# Patient Record
Sex: Male | Born: 1995 | Race: Black or African American | Hispanic: No | Marital: Single | Smoking: Current some day smoker
Health system: Southern US, Community
[De-identification: ages and names within clinical notes are randomized; demographics above are authoritative.]

## PROBLEM LIST (undated history)

## (undated) DIAGNOSIS — F32A Depression, unspecified: Secondary | ICD-10-CM

## (undated) DIAGNOSIS — L309 Dermatitis, unspecified: Secondary | ICD-10-CM

## (undated) DIAGNOSIS — F419 Anxiety disorder, unspecified: Secondary | ICD-10-CM

## (undated) DIAGNOSIS — K219 Gastro-esophageal reflux disease without esophagitis: Secondary | ICD-10-CM

## (undated) DIAGNOSIS — J302 Other seasonal allergic rhinitis: Secondary | ICD-10-CM

## (undated) DIAGNOSIS — F909 Attention-deficit hyperactivity disorder, unspecified type: Secondary | ICD-10-CM

## (undated) DIAGNOSIS — F319 Bipolar disorder, unspecified: Secondary | ICD-10-CM

## (undated) DIAGNOSIS — F329 Major depressive disorder, single episode, unspecified: Secondary | ICD-10-CM

## (undated) HISTORY — DX: Gastro-esophageal reflux disease without esophagitis: K21.9

## (undated) HISTORY — DX: Bipolar disorder, unspecified: F31.9

## (undated) HISTORY — PX: TONSILLECTOMY: SUR1361

## (undated) HISTORY — PX: APPENDECTOMY: SHX54

## (undated) HISTORY — PX: ADENOIDECTOMY: SUR15

## (undated) HISTORY — DX: Anxiety disorder, unspecified: F41.9

---
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
## 2014-01-01 ENCOUNTER — Encounter (HOSPITAL_COMMUNITY): Payer: Self-pay | Admitting: Emergency Medicine

## 2014-01-01 ENCOUNTER — Emergency Department (HOSPITAL_COMMUNITY)
Admission: EM | Admit: 2014-01-01 | Discharge: 2014-01-02 | Disposition: A | Discharge status: Home / Self Care | Payer: Medicaid Other | Attending: Emergency Medicine | Admitting: Emergency Medicine

## 2014-01-01 ENCOUNTER — Emergency Department (HOSPITAL_COMMUNITY): Payer: Medicaid Other

## 2014-01-01 DIAGNOSIS — R109 Unspecified abdominal pain: Secondary | ICD-10-CM | POA: Insufficient documentation

## 2014-01-01 DIAGNOSIS — Z872 Personal history of diseases of the skin and subcutaneous tissue: Secondary | ICD-10-CM | POA: Insufficient documentation

## 2014-01-01 DIAGNOSIS — F17200 Nicotine dependence, unspecified, uncomplicated: Secondary | ICD-10-CM | POA: Insufficient documentation

## 2014-01-01 DIAGNOSIS — R0789 Other chest pain: Secondary | ICD-10-CM | POA: Insufficient documentation

## 2014-01-01 DIAGNOSIS — Z79899 Other long term (current) drug therapy: Secondary | ICD-10-CM | POA: Insufficient documentation

## 2014-01-01 DIAGNOSIS — Z880 Allergy status to penicillin: Secondary | ICD-10-CM | POA: Insufficient documentation

## 2014-01-01 DIAGNOSIS — Z8719 Personal history of other diseases of the digestive system: Secondary | ICD-10-CM | POA: Insufficient documentation

## 2014-01-01 DIAGNOSIS — Z9089 Acquired absence of other organs: Secondary | ICD-10-CM | POA: Insufficient documentation

## 2014-01-01 DIAGNOSIS — J45909 Unspecified asthma, uncomplicated: Secondary | ICD-10-CM | POA: Insufficient documentation

## 2014-01-01 DIAGNOSIS — Z8659 Personal history of other mental and behavioral disorders: Secondary | ICD-10-CM | POA: Insufficient documentation

## 2014-01-01 HISTORY — DX: Depression, unspecified: F32.A

## 2014-01-01 HISTORY — DX: Attention-deficit hyperactivity disorder, unspecified type: F90.9

## 2014-01-01 HISTORY — DX: Major depressive disorder, single episode, unspecified: F32.9

## 2014-01-01 LAB — CBC
HEMATOCRIT: 39.9 % (ref 39.0–52.0)
HEMOGLOBIN: 12.8 g/dL — AB (ref 13.0–17.0)
MCH: 28.1 pg (ref 26.0–34.0)
MCHC: 32.1 g/dL (ref 30.0–36.0)
MCV: 87.7 fL (ref 78.0–100.0)
Platelets: 160 10*3/uL (ref 150–400)
RBC: 4.55 MIL/uL (ref 4.22–5.81)
RDW: 13.5 % (ref 11.5–15.5)
WBC: 6.3 10*3/uL (ref 4.0–10.5)

## 2014-01-01 LAB — BASIC METABOLIC PANEL
BUN: 9 mg/dL (ref 6–23)
CO2: 25 meq/L (ref 19–32)
CREATININE: 0.93 mg/dL (ref 0.50–1.35)
Calcium: 9.2 mg/dL (ref 8.4–10.5)
Chloride: 101 mEq/L (ref 96–112)
GFR calc Af Amer: >90 mL/min (ref >90)
Glucose, Bld: 98 mg/dL (ref 70–99)
Potassium: 4.2 mEq/L (ref 3.7–5.3)
Sodium: 138 mEq/L (ref 137–147)

## 2014-01-01 LAB — I-STAT TROPONIN, ED: TROPONIN I, POC: 0 ng/mL (ref 0.00–0.08)

## 2014-01-01 LAB — PRO B NATRIURETIC PEPTIDE: Pro B Natriuretic peptide (BNP): 7.5 pg/mL (ref 0–125)

## 2014-01-01 NOTE — ED Provider Notes (Signed)
CSN: [id number]     Arrival date & time 01/01/14  2141 History   First MD Initiated Contact with Patient 01/01/14 2341     Chief Complaint  Patient presents with  . Chest Pain     (Consider location/radiation/quality/duration/timing/severity/associated sxs/prior Treatment) HPI Patient presents with a complaints of chest tightness starting at 9:00 this evening. He states "it feels like a heavy fatty tissue around my heart". He states "it feels like my arms are going lifeless". He also states that the discomfort started in his abdomen and his progress to his "whole torso". The chest tightness is improved with movement of his upper extremities above his head. Patient denies any lower extremity swelling or pain. He's had no recent hospitalizations, surgeries or extended travel. He denies cough or shortness of breath. Past Medical History  Diagnosis Date  . Asthma   . Seasonal allergies   . Eczema   . GERD (gastroesophageal reflux disease)   . ADHD (attention deficit hyperactivity disorder)   . Depression    Past Surgical History  Procedure Laterality Date  . Tonsillectomy    . Adenoidectomy    . Laparoscopic appendectomy N/A 08/19/2013    Procedure: APPENDECTOMY LAPAROSCOPIC;  Surgeon: Shelly Rubenstein, MD;  Location: [facility] OR;  Service: General;  Laterality: N/A;  . Appendectomy     Family History  Problem Relation Age of Onset  . Cancer Paternal Grandmother     ovarian   History  Substance Use Topics  . Smoking status: Current Every Day Smoker -- 0.25 packs/day    Types: Cigarettes  . Smokeless tobacco: Never Used  . Alcohol Use: Yes    Review of Systems  Constitutional: Negative for fever and chills.  Respiratory: Positive for chest tightness. Negative for cough and shortness of breath.   Cardiovascular: Positive for chest pain. Negative for palpitations and leg swelling.  Gastrointestinal: Positive for abdominal pain. Negative for nausea, vomiting and diarrhea.   Musculoskeletal: Negative for back pain, neck pain and neck stiffness.  Skin: Negative for rash and wound.  Neurological: Negative for dizziness, weakness, light-headedness, numbness and headaches.  All other systems reviewed and are negative.     Allergies  Minocin; Shellfish allergy; Amoxicillin-pot clavulanate; and Penicillins  Home Medications   Prior to Admission medications   Medication Sig Start Date End Date Taking? Authorizing Provider  albuterol (PROVENTIL HFA;VENTOLIN HFA) 108 (90 BASE) MCG/ACT inhaler Inhale 4 puffs into the lungs every 4 (four) hours as needed for wheezing. 03/31/13   Arley Phenix, MD  cetirizine (ZYRTEC) 10 MG chewable tablet Chew 10 mg by mouth daily.    Historical Provider, MD  griseofulvin microsize (GRIFULVIN V) 125 MG/5ML suspension Take 250 mg by mouth 2 (two) times daily.     Historical Provider, MD   BP 107/61  Pulse 73  Temp(Src) 97.8 F (36.6 C) (Oral)  Resp 19  Ht 6' (1.829 m)  Wt 180 lb (81.647 kg)  BMI 24.41 kg/m2  SpO2 100% Physical Exam  Nursing note and vitals reviewed. Constitutional: He is oriented to person, place, and time. He appears well-developed and well-nourished. No distress.  Patient is in no acute distress.  HENT:  Head: Normocephalic and atraumatic.  Mouth/Throat: Oropharynx is clear and moist. No oropharyngeal exudate.  Eyes: EOM are normal. Pupils are equal, round, and reactive to light.  Neck: Normal range of motion. Neck supple.  Cardiovascular: Normal rate and regular rhythm.  Exam reveals no gallop and no friction rub.   No murmur  heard. Pulmonary/Chest: Effort normal and breath sounds normal. No respiratory distress. He has no wheezes. He has no rales. He exhibits tenderness (mild chest tenderness with palpation of the anterior chest wall diffusely.).  Abdominal: Soft. Bowel sounds are normal. He exhibits no distension and no mass. There is no tenderness. There is no rebound and no guarding.   Musculoskeletal: Normal range of motion. He exhibits no edema and no tenderness.  No calf swelling or tenderness.  Neurological: He is alert and oriented to person, place, and time.  Moves all extremities without deficit. Sensation is grossly intact.  Skin: Skin is warm and dry. No rash noted. No erythema.  Psychiatric:  Dysphoric mood with flat affect.    ED Course  Procedures (including critical care time) Labs Review Labs Reviewed  CBC - Abnormal; Notable for the following:    Hemoglobin 12.8 (*)    All other components within normal limits  BASIC METABOLIC PANEL  PRO B NATRIURETIC PEPTIDE  I-STAT TROPOININ, ED    Imaging Review Dg Chest 2 View  01/01/2014   CLINICAL DATA:  18 year old male with chest pain shortness of breath and fever. Initial encounter.  EXAM: CHEST  2 VIEW  COMPARISON:  08/21/2013 and earlier.  FINDINGS: Normal lung volumes. Normal cardiac size and mediastinal contours. Visualized tracheal air column is within normal limits. The lungs are clear. No pneumothorax or effusion. No osseous abnormality identified.  IMPRESSION: Negative, no acute cardiopulmonary abnormality.   Electronically Signed   By: Augusto GambleLee  Hall M.D.   On: 01/01/2014 22:51     EKG Interpretation   Date/Time:  Saturday Jan 01 2014 21:45:40 EDT Ventricular Rate:  80 PR Interval:  168 QRS Duration: 100 QT Interval:  360 QTC Calculation: 415 R Axis:   69 Text Interpretation:  Normal sinus rhythm Incomplete right bundle branch  block Borderline ECG Confirmed by Maripat Borba  MD, Taysha Majewski ([id number]) on  01/01/2014 11:43:03 PM     no acute changes when compared to old EKG  MDM   Final diagnoses:  None     EKG and initial laboratory workup are negative. Patient has very atypical symptoms. Given his young age and lack of risk factors, I have a low suspicion for coronary artery disease. Symptoms likely related to underlying psychiatric disorder and possibly musculoskeletal in origin. We'll treat  symptomatically, get delta troponin and reassess  Patient is resting comfortably. Vital signs are stable. Troponin x2 are normal. We'll prescription for anti-inflammatories and discharged home. Return precautions given.  Loren Raceravid Mellany Dinsmore, MD 01/02/14 [phone]

## 2014-01-01 NOTE — ED Notes (Signed)
Pt presents with non radiating mid-sternum chest pressure starting last night, pt states his pain was so bad he could not stand up straight. Pt also reports nausea and SOB. Pt states he has to hold his hands above his head to relieve the pressure

## 2014-01-02 LAB — I-STAT TROPONIN, ED: Troponin i, poc: 0 ng/mL (ref 0.00–0.08)

## 2014-01-02 MED ORDER — KETOROLAC TROMETHAMINE 60 MG/2ML IM SOLN
60.0000 mg | Freq: Once | INTRAMUSCULAR | Status: AC
  Administered 2014-01-02: 60 mg via INTRAMUSCULAR
  Filled 2014-01-02: qty 2

## 2014-01-02 MED ORDER — IBUPROFEN 600 MG PO TABS: 600.0000 mg | ORAL_TABLET | Freq: Four times a day (QID) | ORAL | Status: DC | PRN

## 2014-01-02 NOTE — ED Notes (Signed)
Patient is alert and orientedx4.  Patient was explained discharge instructions and they understood them with no questions.  The patient's mother, Aviva Signs, is taking the patient home.

## 2014-01-02 NOTE — Discharge Instructions (Signed)
Chest Pain (Nonspecific) °It is often hard to give a specific diagnosis for the cause of chest pain. There is always a chance that your pain could be related to something serious, such as a heart attack or a blood clot in the lungs. You need to follow up with your caregiver for further evaluation. °CAUSES  °· Heartburn. °· Pneumonia or bronchitis. °· Anxiety or stress. °· Inflammation around your heart (pericarditis) or lung (pleuritis or pleurisy). °· A blood clot in the lung. °· A collapsed lung (pneumothorax). It can develop suddenly on its own (spontaneous pneumothorax) or from injury (trauma) to the chest. °· Shingles infection (herpes zoster virus). °The chest wall is composed of bones, muscles, and cartilage. Any of these can be the source of the pain. °· The bones can be bruised by injury. °· The muscles or cartilage can be strained by coughing or overwork. °· The cartilage can be affected by inflammation and become sore (costochondritis). °DIAGNOSIS  °Lab tests or other studies, such as X-rays, electrocardiography, stress testing, or cardiac imaging, may be needed to find the cause of your pain.  °TREATMENT  °· Treatment depends on what may be causing your chest pain. Treatment may include: °· Acid blockers for heartburn. °· Anti-inflammatory medicine. °· Pain medicine for inflammatory conditions. °· Antibiotics if an infection is present. °· You may be advised to change lifestyle habits. This includes stopping smoking and avoiding alcohol, caffeine, and chocolate. °· You may be advised to keep your head raised (elevated) when sleeping. This reduces the chance of acid going backward from your stomach into your esophagus. °· Most of the time, nonspecific chest pain will improve within 2 to 3 days with rest and mild pain medicine. °HOME CARE INSTRUCTIONS  °· If antibiotics were prescribed, take your antibiotics as directed. Finish them even if you start to feel better. °· For the next few days, avoid physical  activities that bring on chest pain. Continue physical activities as directed. °· Do not smoke. °· Avoid drinking alcohol. °· Only take over-the-counter or prescription medicine for pain, discomfort, or fever as directed by your caregiver. °· Follow your caregiver's suggestions for further testing if your chest pain does not go away. °· Keep any follow-up appointments you made. If you do not go to an appointment, you could develop lasting (chronic) problems with pain. If there is any problem keeping an appointment, you must call to reschedule. °SEEK MEDICAL CARE IF:  °· You think you are having problems from the medicine you are taking. Read your medicine instructions carefully. °· Your chest pain does not go away, even after treatment. °· You develop a rash with blisters on your chest. °SEEK IMMEDIATE MEDICAL CARE IF:  °· You have increased chest pain or pain that spreads to your arm, neck, jaw, back, or abdomen. °· You develop shortness of breath, an increasing cough, or you are coughing up blood. °· You have severe back or abdominal pain, feel nauseous, or vomit. °· You develop severe weakness, fainting, or chills. °· You have a fever. °THIS IS AN EMERGENCY. Do not wait to see if the pain will go away. Get medical help at once. Call your local emergency services (911 in [place] Do not drive yourself to the hospital. °MAKE SURE YOU:  °· Understand these instructions. °· Will watch your condition. °· Will get help right away if you are not doing well or get worse. °Document Released: 05/01/2005 Document Revised: 10/14/2011 Document Reviewed: 02/25/2008 °[employer]® Patient Information ©2014 [employer]   [employer]. ° °

## 2014-01-03 ENCOUNTER — Encounter (HOSPITAL_COMMUNITY): Payer: Self-pay | Admitting: Emergency Medicine

## 2014-01-03 ENCOUNTER — Emergency Department (HOSPITAL_COMMUNITY)
Admission: EM | Admit: 2014-01-03 | Discharge: 2014-01-03 | Disposition: A | Discharge status: Home / Self Care | Payer: Medicaid Other | Attending: Emergency Medicine | Admitting: Emergency Medicine

## 2014-01-03 DIAGNOSIS — R079 Chest pain, unspecified: Secondary | ICD-10-CM | POA: Insufficient documentation

## 2014-01-03 DIAGNOSIS — F17200 Nicotine dependence, unspecified, uncomplicated: Secondary | ICD-10-CM | POA: Insufficient documentation

## 2014-01-03 DIAGNOSIS — Z880 Allergy status to penicillin: Secondary | ICD-10-CM | POA: Insufficient documentation

## 2014-01-03 DIAGNOSIS — Z8719 Personal history of other diseases of the digestive system: Secondary | ICD-10-CM | POA: Insufficient documentation

## 2014-01-03 DIAGNOSIS — Z9089 Acquired absence of other organs: Secondary | ICD-10-CM | POA: Insufficient documentation

## 2014-01-03 DIAGNOSIS — J45909 Unspecified asthma, uncomplicated: Secondary | ICD-10-CM | POA: Insufficient documentation

## 2014-01-03 DIAGNOSIS — R1084 Generalized abdominal pain: Secondary | ICD-10-CM | POA: Insufficient documentation

## 2014-01-03 DIAGNOSIS — F329 Major depressive disorder, single episode, unspecified: Secondary | ICD-10-CM | POA: Insufficient documentation

## 2014-01-03 DIAGNOSIS — R109 Unspecified abdominal pain: Secondary | ICD-10-CM

## 2014-01-03 DIAGNOSIS — Z872 Personal history of diseases of the skin and subcutaneous tissue: Secondary | ICD-10-CM | POA: Insufficient documentation

## 2014-01-03 LAB — COMPREHENSIVE METABOLIC PANEL
ALT: 75 U/L — ABNORMAL HIGH (ref 0–53)
AST: 55 U/L — AB (ref 0–37)
Albumin: 3.6 g/dL (ref 3.5–5.2)
Alkaline Phosphatase: 61 U/L (ref 39–117)
BILIRUBIN TOTAL: 0.8 mg/dL (ref 0.3–1.2)
BUN: 9 mg/dL (ref 6–23)
CO2: 27 meq/L (ref 19–32)
CREATININE: 0.98 mg/dL (ref 0.50–1.35)
Calcium: 8.9 mg/dL (ref 8.4–10.5)
Chloride: 105 mEq/L (ref 96–112)
GLUCOSE: 88 mg/dL (ref 70–99)
Potassium: 3.9 mEq/L (ref 3.7–5.3)
SODIUM: 142 meq/L (ref 137–147)
Total Protein: 6.5 g/dL (ref 6.0–8.3)

## 2014-01-03 LAB — RAPID URINE DRUG SCREEN, HOSP PERFORMED
Amphetamines: NOT DETECTED
BARBITURATES: NOT DETECTED
Benzodiazepines: NOT DETECTED
Cocaine: NOT DETECTED
Opiates: NOT DETECTED
Tetrahydrocannabinol: POSITIVE — AB

## 2014-01-03 LAB — CBC WITH DIFFERENTIAL/PLATELET
BASOS ABS: 0 10*3/uL (ref 0.0–0.1)
Basophils Relative: 0 % (ref 0–1)
Eosinophils Absolute: 0.5 10*3/uL (ref 0.0–0.7)
Eosinophils Relative: 10 % — ABNORMAL HIGH (ref 0–5)
HEMATOCRIT: 38.9 % — AB (ref 39.0–52.0)
HEMOGLOBIN: 12.6 g/dL — AB (ref 13.0–17.0)
LYMPHS ABS: 1 10*3/uL (ref 0.7–4.0)
LYMPHS PCT: 19 % (ref 12–46)
MCH: 28.3 pg (ref 26.0–34.0)
MCHC: 32.4 g/dL (ref 30.0–36.0)
MCV: 87.4 fL (ref 78.0–100.0)
MONO ABS: 0.4 10*3/uL (ref 0.1–1.0)
Monocytes Relative: 7 % (ref 3–12)
NEUTROS ABS: 3.4 10*3/uL (ref 1.7–7.7)
Neutrophils Relative %: 64 % (ref 43–77)
Platelets: 138 10*3/uL — ABNORMAL LOW (ref 150–400)
RBC: 4.45 MIL/uL (ref 4.22–5.81)
RDW: 13.6 % (ref 11.5–15.5)
WBC: 5.3 10*3/uL (ref 4.0–10.5)

## 2014-01-03 LAB — ETHANOL

## 2014-01-03 NOTE — ED Notes (Addendum)
Pt c/o chest pressure x several days; was seen on Saturday for same, discharged with prescription for Motrin. Reports chest pressure last night while laying in bed, became short of breath, diaphoretic, and nauseated.  Also c/o abd pain intermittently since appendectomy in January; mother sts "the doctor said, when he had his appendix out, there was a pocket in his abdomen that was inflamed." Pt reports constipation and diarrhea; last BM yesterday; all reports tenderness in all quadrants, does not grimace on palpation

## 2014-01-03 NOTE — ED Notes (Signed)
Per pt having abdominal pain/chest pain for the past few days. sts the chest pain is at night. sts only abdominal pain now and nausea. Seen here for the same a few days ago. sts some diarrhea.

## 2014-01-03 NOTE — ED Notes (Signed)
PA at bedside.

## 2014-01-03 NOTE — ED Provider Notes (Signed)
Medical screening examination/treatment/procedure(s) were performed by non-physician practitioner and as supervising physician I was immediately available for consultation/collaboration.   EKG Interpretation   Date/Time:  Monday January 03 2014 11:30:08 EDT Ventricular Rate:  79 PR Interval:  164 QRS Duration: 112 QT Interval:  362 QTC Calculation: 415 R Axis:   25 Text Interpretation:  Sinus rhythm Incomplete right bundle branch block  Similar to prior Confirmed by Wilkie Aye  MD, COURTNEY ([id number]) on 01/03/2014  11:39:33 AM        Shon Baton, MD 01/03/14 1358

## 2014-01-03 NOTE — ED Provider Notes (Signed)
CSN: [id number]     Arrival date & time 01/03/14  1023 History   First MD Initiated Contact with Patient 01/03/14 1122     Chief Complaint  Patient presents with  . Abdominal Pain     (Consider location/radiation/quality/duration/timing/severity/associated sxs/prior Treatment) HPI  18 year old male with prior laparoscopic appendectomy and history of major depression who presents complaining of abdominal pain and chest pain. Patient reports he has been feeling very stressed out "with life" for the past several months. He feels he has lost motivation to do anything including taking his medication, or cleaning up his room. For the past 3-4 days he has been having both chest and abdominal discomfort. He describes a tightness pressure sensation to his chest, and sensation of heaviness around his heart in which he described "fatty tissue around the heart" he also described abdominal discomfort and sensation of "my organs are shifting around in my body". He was seen here 2 days ago for the exact same complaint, he was ruled out for acute cardiac disease. He is here again because his symptom has not improved. Report occasional shortness of breath, recurrence of sweats, and her nauseous. Patient otherwise denies fever, chills headache, numbness or weakness. Last bowel movement was yesterday. Admits to self medicate with marijuana. Was found to have an ankle brace on his ankle but does not want to talk about it. Denies any significant family premature cardiac disease. Denies other recreational drug use or alcohol use. Patient denies suicidal homicidal or hallucination. He was prescribed antidepressant medication by his PCP several months ago but he only took it once and states it did not help.  Past Medical History  Diagnosis Date  . Asthma   . Seasonal allergies   . Eczema   . GERD (gastroesophageal reflux disease)   . ADHD (attention deficit hyperactivity disorder)   . Depression    Past Surgical  History  Procedure Laterality Date  . Tonsillectomy    . Adenoidectomy    . Laparoscopic appendectomy N/A 08/19/2013    Procedure: APPENDECTOMY LAPAROSCOPIC;  Surgeon: Shelly Rubenstein, MD;  Location: [facility] OR;  Service: General;  Laterality: N/A;  . Appendectomy     Family History  Problem Relation Age of Onset  . Cancer Paternal Grandmother     ovarian   History  Substance Use Topics  . Smoking status: Current Every Day Smoker -- 0.25 packs/day    Types: Cigarettes  . Smokeless tobacco: Never Used  . Alcohol Use: Yes    Review of Systems  All other systems reviewed and are negative.     Allergies  Minocin; Shellfish allergy; Amoxicillin-pot clavulanate; and Penicillins  Home Medications   Prior to Admission medications   Medication Sig Start Date End Date Taking? Authorizing Provider  diphenhydrAMINE (BENADRYL) 25 MG tablet Take 25 mg by mouth every 6 (six) hours as needed for allergies.    Historical Provider, MD  ibuprofen (ADVIL,MOTRIN) 600 MG tablet Take 1 tablet (600 mg total) by mouth every 6 (six) hours as needed. 01/02/14   Loren Racer, MD  loratadine (CLARITIN) 10 MG tablet Take 10 mg by mouth daily as needed for allergies.    Historical Provider, MD   BP 117/68  Pulse 78  Temp(Src) 98.2 F (36.8 C) (Oral)  Resp 18  SpO2 100% Physical Exam  Constitutional: He appears well-developed and well-nourished. No distress.  HENT:  Head: Atraumatic.  Eyes: Conjunctivae are normal.  Neck: Normal range of motion. Neck supple.  Cardiovascular: Normal rate and  regular rhythm.   Pulmonary/Chest: Effort normal and breath sounds normal.  Abdominal: Soft. There is no tenderness.  Patient with mild generalized tenderness to chest and abdomen without focal point tenderness.   Musculoskeletal: He exhibits no edema.  Neurological: He is alert.  Skin: No rash noted.  Psychiatric: Judgment normal. His speech is delayed. He is slowed and withdrawn. Thought content is not  paranoid. Cognition and memory are normal. He exhibits a depressed mood. He expresses no homicidal and no suicidal ideation.  Poor eye contact.    ED Course  Procedures (including critical care time)  12:00 PM Patient here with symptoms suggestive of psychiatric illness and is likely to be organic. He has no significant cardiac risk factors, and he is in no acute distress. He has a benign abdomen. He is not actively  homicidal or suicidal.  Screening labs ordered.    1:46 PM Pt without any active pain.  He report only taking his antidepressant once but stop shortly after because it did not provide any relief.  I believe his sxs may be related to anxiety and depression and will need further management by his PCP.  I offer outpt resources.  At this time his labs are reassuring.  Doubt acute emergent condition.  Mild transaminitis noted, incidental.  Return precaution discussed.  Pt and mother agrees with plan.    Labs Review Labs Reviewed  CBC WITH DIFFERENTIAL - Abnormal; Notable for the following:    Hemoglobin 12.6 (*)    HCT 38.9 (*)    Platelets 138 (*)    Eosinophils Relative 10 (*)    All other components within normal limits  COMPREHENSIVE METABOLIC PANEL - Abnormal; Notable for the following:    AST 55 (*)    ALT 75 (*)    All other components within normal limits  URINE RAPID DRUG SCREEN (HOSP PERFORMED) - Abnormal; Notable for the following:    Tetrahydrocannabinol POSITIVE (*)    All other components within normal limits  ETHANOL    Imaging Review Dg Chest 2 View  01/01/2014   CLINICAL DATA:  18 year old male with chest pain shortness of breath and fever. Initial encounter.  EXAM: CHEST  2 VIEW  COMPARISON:  08/21/2013 and earlier.  FINDINGS: Normal lung volumes. Normal cardiac size and mediastinal contours. Visualized tracheal air column is within normal limits. The lungs are clear. No pneumothorax or effusion. No osseous abnormality identified.  IMPRESSION: Negative, no  acute cardiopulmonary abnormality.   Electronically Signed   By: Augusto GambleLee  Hall M.D.   On: 01/01/2014 22:51     EKG Interpretation   Date/Time:  Monday January 03 2014 11:30:08 EDT Ventricular Rate:  79 PR Interval:  164 QRS Duration: 112 QT Interval:  362 QTC Calculation: 415 R Axis:   25 Text Interpretation:  Sinus rhythm Incomplete right bundle branch block  Similar to prior Confirmed by HORTON  MD, COURTNEY ([id number]) on 01/03/2014  11:39:33 AM      MDM   Final diagnoses:  Abdominal pain  Chest pain    BP 112/66  Pulse 83  Temp(Src) 98.3 F (36.8 C) (Oral)  Resp 22  SpO2 100%  I have reviewed nursing notes and vital signs. I personally reviewed the imaging tests through PACS system  I reviewed available ER/hospitalization records thought the EMR     Fayrene HelperBowie Janesha Brissette, [place] JerseyPA-C 01/03/14 1347

## 2014-01-03 NOTE — Discharge Instructions (Signed)
Please follow up with your doctor for further management of your symptoms.  You may benefit from starting your antidepressant medication and take it as recommended.  Return to ER if you have any concerns.     Emergency Department Resource Guide 1) Find a Doctor and Pay Out of Pocket Although you won't have to find out who is covered by your insurance plan, it is a good idea to ask around and get recommendations. You will then need to call the office and see if the doctor you have chosen will accept you as a new patient and what types of options they offer for patients who are self-pay. Some doctors offer discounts or will set up payment plans for their patients who do not have insurance, but you will need to ask so you aren't surprised when you get to your appointment.  2) Contact Your Local Health Department Not all health departments have doctors that can see patients for sick visits, but many do, so it is worth a call to see if yours does. If you don't know where your local health department is, you can check in your phone book. The CDC also has a tool to help you locate your state's health department, and many state websites also have listings of all of their local health departments.  3) Find a Walk-in Clinic If your illness is not likely to be very severe or complicated, you may want to try a walk in clinic. These are popping up all over the country in pharmacies, drugstores, and shopping centers. They're usually staffed by nurse practitioners or physician assistants that have been trained to treat common illnesses and complaints. They're usually fairly quick and inexpensive. However, if you have serious medical issues or chronic medical problems, these are probably not your best option.  No Primary Care Doctor: - Call Health Connect at  [phone] - they can help you locate a primary care doctor that  accepts your insurance, provides certain services, etc. - Physician Referral Service-  [phone]  Chronic Pain Problems: Organization         Address  Phone   Notes  [facility]  [phone] Patients need to be referred by their primary care doctor.   Medication Assistance: Organization         Address  Phone   Notes  [facility] Medication [facility] [address] [place], [place] [address] [phone] --Must be a resident of [facility] -- Must have NO insurance coverage whatsoever (no Medicaid/ Medicare, etc.) -- The pt. MUST have a primary care doctor that directs their care regularly and follows them in the community   [facility]ist  [phone]   [employer]  [phone]    Agencies that provide inexpensive medical care: Organization         Address  Phone   Notes  [facility]  [phone]   [facility]    [phone]   [facility] [address] [place], [place] [address] [phone]   [facility] [address] [place]. [address] [place] [phone]   Planned Parenthood    [phone]   [facility]    [phone]   [facility] and [facility]  [address], [place] Phone:  [phone], Fax:  [phone] Hours of Operation:  9 am - 6 pm, M-F.  Also accepts Medicaid/Medicare and self-pay.  [facility]  [place] for [place], [address], [place] Phone: [phone], Fax: [phone]. Hours of Operation:  8:30 am - 5:30 pm, M-F.  Also accepts Medicaid and self-pay.  [facility] [address] [place] Phone: [phone]   [place], [place], [place] [phone], Ext. [phone] Mondays & Thursdays: 7-9 AM.  First 15 patients are seen on a first come, first serve basis.    [place] Providers:  Organization         Address  Phone   Notes  [facility] [address],  [place] [phone] Also accepts self-pay patients.  [facility] [id number] [place], [place]  [phone]   [place], [address], [place] [phone]   [facility] [address] [place] [phone]   Lucianne Lei [address], [place]   [phone] Only accepts [place] Access [place] patients after they have their name applied to their card.   Self-Pay (no insurance) in [facility]:  Organization         Address  Phone   Notes  Sickle Cell Patients, [facility] Internal Medicine [place] [phone]   [facility] [place] [phone]   [facility] [place]  [place], [place], [place] [phone]   [facility]/Dr. Osei-Bonsu  [address], [place] or [address], [place] [phone] Phone number for both [place] and [place] locations is the same.  [facility] [address] [place] [phone]   [facility]  [address], [place] or [address] [place] [address] [phone]   [facility] [address], [place] [phone], phone; [phone], fax Sees patients 1st and 3rd Saturday of every month.  Must not qualify for public or private insurance (i.e. Medicaid, Medicare, [place] Health Choice, Veterans' Benefits)  Household income should be no more than 200% of the poverty level The clinic cannot treat you if you are pregnant or think you are pregnant  Sexually transmitted diseases are not treated at the clinic.    Dental Care: Organization         Address  Phone  Notes  [facility] Department of [facility] [place] [phone] Accepts children up to age 40 who are enrolled in  [place] or [place]; pregnant women with a Medicaid card; and children who have applied for Medicaid or [place] Health Choice, but were declined, whose parents can pay a reduced fee at time of service.  [facility] Department of [facility]  [address], [place] [phone] Accepts children up to age 85 who are enrolled in [place] or [place]; pregnant women with a Medicaid card; and children who have applied for Medicaid or [place] Health Choice, but were declined, whose parents can pay a reduced fee at time of service.  [place] [facility] Access PRO[facility]  [place] [phone] Patients are seen by appointment only. Walk-ins are not accepted. [place] will see patients 62 years of age and older. Monday - Tuesday (8am-5pm) Most Wednesdays (8:30-5pm) $30 per visit,  cash only  [employer] Adult [employer] PROGRAM  [address], [place] [phone] Patients are seen by appointment only. Walk-ins are not accepted. [place] will see patients 68 years of age and older. One Wednesday Evening (Monthly: Volunteer Based).  $30 per visit, cash only  [place]  [phone] for adults; Children under age 32, call Graduate Pediatric Dentistry at [phone]. Children aged 36-14, please call [phone] to request a pediatric application.  Dental services are provided in all areas of dental care including fillings, crowns and bridges, complete and partial dentures, implants, gum treatment, root canals, and extractions. Preventive care is also provided. Treatment is provided to both adults and children. Patients are selected via a lottery and there is often a waiting list.   [facility] [address] [place]  [phone] [email or web address]   [facility] [address] [place], [place] [phone], Ext. [phone] Second and Fourth Thursday of each month, opens at 6:30  AM; Clinic ends at 9 AM.  Patients are seen on a first-come first-served basis, and a limited number are seen during each clinic.   [facility]  [address] Hillard Danker [place], [place] [phone]   Eligibility Requirements You must have lived in [place], [place], or [place] counties for at least the last three months.   You cannot be eligible for state or federal sponsored [employer], including [employer], [place], or [employer].   You generally cannot be eligible for healthcare insurance through your employer.    How to apply: Eligibility screenings are held every Tuesday and Wednesday afternoon from 1:00 pm until 4:00 pm. You do not need an appointment for the interview!  [facility] [address] [place], [place]   [place]  [place] Department  [place]  [phone]    Behavioral Health Resources in the Community: Intensive Outpatient Programs Organization         Address  Phone  Notes  Bejou Flanagan. [address], [place], [place] [phone]   [facility] Outpatient [address] [place], [place]   ADS: Alcohol & Drug Svcs [address] [place], [place]   [place] [address],  [place], [place] or [phone]   Substance Abuse Resources Organization         Address  Phone  Notes  Alcohol and Drug Services  [phone]   [place]  [phone]   [facility] [place]   Chinita Pester  [phone]   Residential & Outpatient Substance Abuse Program  [phone]   Psychological Services Organization         Address  Phone  Notes  [facility] [place]  [place]  [phone]   [place] [address] [place] or  [phone]    Mobile Crisis Teams Organization         Address  Phone  Notes  [facility], Mobile Crisis Care Unit  [phone]   [facility]  [address] [place], [place]   [place] [address], [place] [phone]    Self-Help/Support Groups Organization         Address  Phone  Notes  [facility] [place] - variety of support groups  [place] Call for more information  Narcotics Anonymous (NA), [facility] [address], [employer] [place]  2 meetings at this location   Special educational needs teacher         Address  Phone  Notes  [facility] Residential Treatment [place],    [place]  [phone]   [facility]  [address] [place] [id number], [place], [place]   [place], [place] [phone] Admissions: 8am-3pm M-F  Incentives Substance [place] [address]    [place], [place] [phone]   [facility] [address] [place], [place], [place]   [facility] [address],  [place], [place]   [facility] - Intensive Outpatient [address], [place], [place]   [facility] [address]  [place], [place] [phone] or [phone]   [facility] (RTS) [address] [place], [place] Accepts Medicaid  [facility] [place] [address],  [place] [phone] Substance Abuse/Addiction Treatment   [facility] Organization         Address  Phone  Notes  [facility]  [phone]   Domenic Schwab, PhD [address] Arlis Porta [place], [place]   [phone] or [phone]   [place] and [place], [place] [phone]   [facility] [address],  [place], [place] [phone] Insurance/Medicaid/sponsorship through [facility] and Families [address], [address] [place]                                    [place], [place] [phone] [place] [address], [place] [phone]    Dr. Adele Schilder  [phone]   [facility] Dept. 1) [address], [place] 2) [place] 3)  [place] [address], [place] [phone]  [phone]   [place] [phone] or [phone] (After Hours)

## 2014-01-03 NOTE — ED Notes (Signed)
Corrie Dandy, [place] student at bedside

## 2014-01-03 NOTE — ED Notes (Signed)
[place] at bedside 

## 2014-06-22 ENCOUNTER — Emergency Department (HOSPITAL_COMMUNITY)
Admission: EM | Admit: 2014-06-22 | Discharge: 2014-06-22 | Disposition: A | Discharge status: Home / Self Care | Payer: Self-pay | Attending: Emergency Medicine | Admitting: Emergency Medicine

## 2014-06-22 ENCOUNTER — Encounter (HOSPITAL_COMMUNITY): Payer: Self-pay | Admitting: Emergency Medicine

## 2014-06-22 ENCOUNTER — Emergency Department (HOSPITAL_COMMUNITY): Payer: Medicaid Other

## 2014-06-22 DIAGNOSIS — K5904 Chronic idiopathic constipation: Secondary | ICD-10-CM

## 2014-06-22 DIAGNOSIS — Z872 Personal history of diseases of the skin and subcutaneous tissue: Secondary | ICD-10-CM | POA: Insufficient documentation

## 2014-06-22 DIAGNOSIS — Z720 Tobacco use: Secondary | ICD-10-CM | POA: Insufficient documentation

## 2014-06-22 DIAGNOSIS — Z79899 Other long term (current) drug therapy: Secondary | ICD-10-CM | POA: Insufficient documentation

## 2014-06-22 DIAGNOSIS — R52 Pain, unspecified: Secondary | ICD-10-CM

## 2014-06-22 DIAGNOSIS — Z880 Allergy status to penicillin: Secondary | ICD-10-CM | POA: Insufficient documentation

## 2014-06-22 DIAGNOSIS — J45909 Unspecified asthma, uncomplicated: Secondary | ICD-10-CM | POA: Insufficient documentation

## 2014-06-22 DIAGNOSIS — Z8659 Personal history of other mental and behavioral disorders: Secondary | ICD-10-CM | POA: Insufficient documentation

## 2014-06-22 DIAGNOSIS — K5900 Constipation, unspecified: Secondary | ICD-10-CM | POA: Insufficient documentation

## 2014-06-22 DIAGNOSIS — Z9089 Acquired absence of other organs: Secondary | ICD-10-CM | POA: Insufficient documentation

## 2014-06-22 LAB — COMPREHENSIVE METABOLIC PANEL
ALBUMIN: 3.5 g/dL (ref 3.5–5.2)
ALT: 21 U/L (ref 0–53)
AST: 22 U/L (ref 0–37)
Alkaline Phosphatase: 64 U/L (ref 39–117)
Anion gap: 11 (ref 5–15)
BILIRUBIN TOTAL: 0.2 mg/dL — AB (ref 0.3–1.2)
BUN: 9 mg/dL (ref 6–23)
CO2: 26 mEq/L (ref 19–32)
Calcium: 9 mg/dL (ref 8.4–10.5)
Chloride: 104 mEq/L (ref 96–112)
Creatinine, Ser: 1.03 mg/dL (ref 0.50–1.35)
GFR calc Af Amer: >90 mL/min (ref >90)
GFR calc non Af Amer: >90 mL/min (ref >90)
Glucose, Bld: 101 mg/dL — ABNORMAL HIGH (ref 70–99)
Potassium: 4 mEq/L (ref 3.7–5.3)
Sodium: 141 mEq/L (ref 137–147)
Total Protein: 5.8 g/dL — ABNORMAL LOW (ref 6.0–8.3)

## 2014-06-22 LAB — CBC WITH DIFFERENTIAL/PLATELET
BASOS ABS: 0 10*3/uL (ref 0.0–0.1)
Basophils Relative: 0 % (ref 0–1)
EOS PCT: 6 % — AB (ref 0–5)
Eosinophils Absolute: 0.4 10*3/uL (ref 0.0–0.7)
HCT: 40.4 % (ref 39.0–52.0)
Hemoglobin: 13.1 g/dL (ref 13.0–17.0)
Lymphocytes Relative: 59 % — ABNORMAL HIGH (ref 12–46)
Lymphs Abs: 3.5 10*3/uL (ref 0.7–4.0)
MCH: 27.8 pg (ref 26.0–34.0)
MCHC: 32.4 g/dL (ref 30.0–36.0)
MCV: 85.8 fL (ref 78.0–100.0)
MONO ABS: 0.3 10*3/uL (ref 0.1–1.0)
Monocytes Relative: 5 % (ref 3–12)
Neutro Abs: 1.8 10*3/uL (ref 1.7–7.7)
Neutrophils Relative %: 30 % — ABNORMAL LOW (ref 43–77)
Platelets: 166 10*3/uL (ref 150–400)
RBC: 4.71 MIL/uL (ref 4.22–5.81)
RDW: 13 % (ref 11.5–15.5)
WBC: 6 10*3/uL (ref 4.0–10.5)

## 2014-06-22 MED ORDER — DICYCLOMINE HCL 10 MG/ML IM SOLN
20.0000 mg | Freq: Once | INTRAMUSCULAR | Status: AC
  Administered 2014-06-22: 20 mg via INTRAMUSCULAR
  Filled 2014-06-22: qty 2

## 2014-06-22 MED ORDER — KETOROLAC TROMETHAMINE 60 MG/2ML IM SOLN
60.0000 mg | Freq: Once | INTRAMUSCULAR | Status: AC
  Administered 2014-06-22: 60 mg via INTRAMUSCULAR
  Filled 2014-06-22: qty 2

## 2014-06-22 MED ORDER — DICYCLOMINE HCL 20 MG PO TABS: 20.0000 mg | ORAL_TABLET | Freq: Two times a day (BID) | ORAL | Status: DC

## 2014-06-22 MED ORDER — POLYETHYLENE GLYCOL 3350 17 GM/SCOOP PO POWD: 17.0000 g | Freq: Every day | ORAL | Status: DC

## 2014-06-22 NOTE — ED Notes (Signed)
Patient reports abdominal pain since yesterday. Patient reports pain as a pulling pain and in his RUQ and LUQ. No pulsating mass noted. Denies n/v. LBM yesterday reports it was normal. Denies urinary issues.

## 2014-06-22 NOTE — ED Provider Notes (Addendum)
CSN: [id number]     Arrival date & time 06/22/14  0402 History   First MD Initiated Contact with Patient 06/22/14 0440     Chief Complaint  Patient presents with  . Abdominal Pain     (Consider location/radiation/quality/duration/timing/severity/associated sxs/prior Treatment) Patient is a 18 y.o. male presenting with cramps. The history is provided by the patient.  Abdominal Cramping This is a new problem. The current episode started yesterday. The problem occurs constantly. The problem has not changed since onset.Pertinent negatives include no chest pain, no headaches and no shortness of breath. Nothing aggravates the symptoms. Nothing relieves the symptoms. He has tried nothing for the symptoms. The treatment provided no relief.    Past Medical History  Diagnosis Date  . Asthma   . Seasonal allergies   . Eczema   . GERD (gastroesophageal reflux disease)   . ADHD (attention deficit hyperactivity disorder)   . Depression    Past Surgical History  Procedure Laterality Date  . Tonsillectomy    . Adenoidectomy    . Laparoscopic appendectomy N/A 08/19/2013    Procedure: APPENDECTOMY LAPAROSCOPIC;  Surgeon: Shelly Rubensteinouglas A Blackman, MD;  Location: [facility] OR;  Service: General;  Laterality: N/A;  . Appendectomy     Family History  Problem Relation Age of Onset  . Cancer Paternal Grandmother     ovarian   History  Substance Use Topics  . Smoking status: Current Every Day Smoker -- 0.25 packs/day    Types: Cigarettes  . Smokeless tobacco: Never Used  . Alcohol Use: Yes    Review of Systems  Respiratory: Negative for shortness of breath.   Cardiovascular: Negative for chest pain.  Neurological: Negative for headaches.      Allergies  Minocin; Shellfish allergy; Amoxicillin-pot clavulanate; and Penicillins  Home Medications   Prior to Admission medications   Medication Sig Start Date End Date Taking? Authorizing Provider  albuterol (PROVENTIL HFA;VENTOLIN HFA) 108 (90 BASE)  MCG/ACT inhaler Inhale 2 puffs into the lungs every 6 (six) hours as needed for wheezing or shortness of breath.   Yes Historical Provider, MD  diphenhydrAMINE (BENADRYL) 25 MG tablet Take 25 mg by mouth every 6 (six) hours as needed for allergies.   Yes Historical Provider, MD  ibuprofen (ADVIL,MOTRIN) 600 MG tablet Take 1 tablet (600 mg total) by mouth every 6 (six) hours as needed. Patient taking differently: Take 600 mg by mouth every 6 (six) hours as needed for moderate pain.  01/02/14  Yes Loren Raceravid Yelverton, MD  loratadine (CLARITIN) 10 MG tablet Take 10 mg by mouth daily as needed for allergies.   Yes Historical Provider, MD  dicyclomine (BENTYL) 20 MG tablet Take 1 tablet (20 mg total) by mouth 2 (two) times daily. 06/22/14   Elliott Lasecki K Estle Sabella-Rasch, MD  polyethylene glycol powder (MIRALAX) powder Take 17 g by mouth daily. 06/22/14   Sidney Kann K Nilesh Stegall-Rasch, MD   BP 113/70 mmHg  Pulse 64  Temp(Src) 98.7 F (37.1 C) (Oral)  Resp 16  SpO2 100% Physical Exam  Constitutional: He is oriented to person, place, and time. He appears well-developed and well-nourished. No distress.  HENT:  Head: Normocephalic and atraumatic.  Mouth/Throat: Oropharynx is clear and moist.  Eyes: Conjunctivae are normal. Pupils are equal, round, and reactive to light.  Neck: Normal range of motion. Neck supple.  Cardiovascular: Normal rate, regular rhythm and intact distal pulses.   Pulmonary/Chest: Effort normal and breath sounds normal. He has no wheezes. He has no rales.  Abdominal: Soft. Bowel  sounds are increased. There is no tenderness. There is no rebound and no guarding.  Musculoskeletal: Normal range of motion.  Neurological: He is alert and oriented to person, place, and time.  Skin: Skin is warm and dry.  Psychiatric: He has a normal mood and affect.    ED Course  Procedures (including critical care time) Labs Review Labs Reviewed  COMPREHENSIVE METABOLIC PANEL - Abnormal; Notable for the following:     Glucose, Bld 101 (*)    Total Protein 5.8 (*)    Total Bilirubin 0.2 (*)    All other components within normal limits  CBC WITH DIFFERENTIAL - Abnormal; Notable for the following:    Neutrophils Relative % 30 (*)    Lymphocytes Relative 59 (*)    Eosinophils Relative 6 (*)    All other components within normal limits    Imaging Review Dg Abd Acute W/chest  06/22/2014   CLINICAL DATA:  Upper abdominal pain, nausea and diarrhea.  EXAM: ACUTE ABDOMEN SERIES (ABDOMEN 2 VIEW & CHEST 1 VIEW)  COMPARISON:  Chest 01/01/2014.  Abdomen 08/19/2013.  FINDINGS: Normal heart size and pulmonary vascularity. No focal airspace disease or consolidation in the lungs. No blunting of costophrenic angles. No pneumothorax. Mediastinal contours appear intact.  Stool-filled colon. No small or large bowel distention. No free intra-abdominal air. No abnormal air-fluid levels. No radiopaque stones. Visualized bones appear intact.  IMPRESSION: No evidence of active pulmonary disease. Stool-filled colon. Nonobstructive bowel gas pattern.   Electronically Signed   By: Burman NievesWilliam  Stevens M.D.   On: 06/22/2014 06:03     EKG Interpretation None      MDM   Final diagnoses:  Pain  Constipation - functional    Constipation and cramping secondary to same.  Will start miralax therapy    Giovoni Bunch K Damon Baisch-Rasch, MD 06/22/14 [id number]  Anae Hams K Wiktoria Hemrick-Rasch, MD 06/28/14 2316

## 2014-07-15 ENCOUNTER — Encounter: Payer: Self-pay | Admitting: Internal Medicine

## 2014-07-15 ENCOUNTER — Ambulatory Visit (HOSPITAL_BASED_OUTPATIENT_CLINIC_OR_DEPARTMENT_OTHER): Payer: Self-pay | Admitting: Internal Medicine

## 2014-07-15 VITALS — BP 102/68 | HR 78 | Temp 98.3°F | Resp 16 | Ht 72.0 in | Wt 208.0 lb

## 2014-07-15 DIAGNOSIS — Z5321 Procedure and treatment not carried out due to patient leaving prior to being seen by health care provider: Secondary | ICD-10-CM

## 2014-07-15 DIAGNOSIS — J45909 Unspecified asthma, uncomplicated: Secondary | ICD-10-CM | POA: Insufficient documentation

## 2014-07-15 DIAGNOSIS — R05 Cough: Secondary | ICD-10-CM | POA: Insufficient documentation

## 2014-07-15 DIAGNOSIS — F17210 Nicotine dependence, cigarettes, uncomplicated: Secondary | ICD-10-CM | POA: Insufficient documentation

## 2014-07-15 NOTE — Progress Notes (Signed)
Pt is here to establish care. Pt states that he has had a dry cough for 3 weeks. Pt has a rash on his left wrist.

## 2014-07-15 NOTE — Progress Notes (Signed)
Patient ID: Phillip Leach, male   DOB: [birth year], 18 y.o.   MRN: [id number]   Phillip Mareyrese Vaquera, is a 18 y.o. male  [id number]  [id number]  DOB - [birth year]  CC:  Chief Complaint  Patient presents with  . Establish Care       HPI: Phillip Leach is a 18 y.o. male here today to establish medical care. Dry cough for 3 weeks Rash on Left wrist   Patient has No headache, No chest pain, No abdominal pain - No Nausea, No new weakness tingling or numbness, No Cough - SOB.  Allergies  Allergen Reactions  . Minocin [Minocycline Hcl] Other (See Comments)    Unknown   . Shellfish Allergy Other (See Comments)    Unknown   . Amoxicillin-Pot Clavulanate Rash  . Penicillins Rash   Past Medical History  Diagnosis Date  . Asthma   . Seasonal allergies   . Eczema   . GERD (gastroesophageal reflux disease)   . ADHD (attention deficit hyperactivity disorder)   . Depression    Current Outpatient Prescriptions on File Prior to Visit  Medication Sig Dispense Refill  . diphenhydrAMINE (BENADRYL) 25 MG tablet Take 25 mg by mouth every 6 (six) hours as needed for allergies.    Marland Kitchen. albuterol (PROVENTIL HFA;VENTOLIN HFA) 108 (90 BASE) MCG/ACT inhaler Inhale 2 puffs into the lungs every 6 (six) hours as needed for wheezing or shortness of breath.    . dicyclomine (BENTYL) 20 MG tablet Take 1 tablet (20 mg total) by mouth 2 (two) times daily. (Patient not taking: Reported on 07/15/2014) 20 tablet 0  . ibuprofen (ADVIL,MOTRIN) 600 MG tablet Take 1 tablet (600 mg total) by mouth every 6 (six) hours as needed. (Patient not taking: Reported on 07/15/2014) 30 tablet 0  . loratadine (CLARITIN) 10 MG tablet Take 10 mg by mouth daily as needed for allergies.    . polyethylene glycol powder (MIRALAX) powder Take 17 g by mouth daily. (Patient not taking: Reported on 07/15/2014) 255 g 0   No current facility-administered medications on file prior to visit.   Family History  Problem Relation Age of  Onset  . Cancer Paternal Grandmother     ovarian   History   Social History  . Marital Status: Single    Spouse Name: N/A    Number of Children: N/A  . Years of Education: N/A   Occupational History  . Not on file.   Social History Main Topics  . Smoking status: Current Every Day Smoker -- 0.25 packs/day    Types: Cigarettes  . Smokeless tobacco: Never Used  . Alcohol Use: Yes  . Drug Use: Yes    Special: Marijuana  . Sexual Activity: Yes   Other Topics Concern  . Not on file   Social History Narrative    Review of Systems: Constitutional: Negative for fever, chills, diaphoresis, activity change, appetite change and fatigue. HENT: Negative for ear pain, nosebleeds, congestion, facial swelling, rhinorrhea, neck pain, neck stiffness and ear discharge.  Eyes: Negative for pain, discharge, redness, itching and visual disturbance. Respiratory: Negative for cough, choking, chest tightness, shortness of breath, wheezing and stridor.  Cardiovascular: Negative for chest pain, palpitations and leg swelling. Gastrointestinal: Negative for abdominal distention. Genitourinary: Negative for dysuria, urgency, frequency, hematuria, flank pain, decreased urine volume, difficulty urinating and dyspareunia.  Musculoskeletal: Negative for back pain, joint swelling, arthralgia and gait problem. Neurological: Negative for dizziness, tremors, seizures, syncope, facial asymmetry, speech difficulty, weakness, light-headedness, numbness and headaches.  Hematological: Negative for adenopathy. Does not bruise/bleed easily. Psychiatric/Behavioral: Negative for hallucinations, behavioral problems, confusion, dysphoric mood, decreased concentration and agitation.    Objective:   Filed Vitals:   07/15/14 1419  BP: 102/68  Pulse: 78  Temp: 98.3 F (36.8 C)  Resp: 16    Physical Exam: Constitutional: Patient appears well-developed and well-nourished. No distress. HENT: Normocephalic,  atraumatic, External right and left ear normal. Oropharynx is clear and moist.  Eyes: Conjunctivae and EOM are normal. PERRLA, no scleral icterus. Neck: Normal ROM. Neck supple. No JVD. No tracheal deviation. No thyromegaly. CVS: RRR, S1/S2 +, no murmurs, no gallops, no carotid bruit.  Pulmonary: Effort and breath sounds normal, no stridor, rhonchi, wheezes, rales.  Abdominal: Soft. BS +, no distension, tenderness, rebound or guarding.  Musculoskeletal: Normal range of motion. No edema and no tenderness.  Lymphadenopathy: No lymphadenopathy noted, cervical, inguinal or axillary Neuro: Alert. Normal reflexes, muscle tone coordination. No cranial nerve deficit. Skin: Skin is warm and dry. No rash noted. Not diaphoretic. No erythema. No pallor. Psychiatric: Normal mood and affect. Behavior, judgment, thought content normal.  Lab Results  Component Value Date   WBC 6.0 06/22/2014   HGB 13.1 06/22/2014   HCT 40.4 06/22/2014   MCV 85.8 06/22/2014   PLT 166 06/22/2014   Lab Results  Component Value Date   CREATININE 1.03 06/22/2014   BUN 9 06/22/2014   NA 141 06/22/2014   K 4.0 06/22/2014   CL 104 06/22/2014   CO2 26 06/22/2014    No results found for: HGBA1C Lipid Panel  No results found for: CHOL, TRIG, HDL, CHOLHDL, VLDL, LDLCALC     Assessment and plan:   There are no diagnoses linked to this encounter.  No Follow-up on file.  The patient was given clear instructions to go to ER or return to medical center if symptoms don't improve, worsen or new problems develop. The patient verbalized understanding. The patient was told to call to get lab results if they haven't heard anything in the next week.     Holland CommonsKECK, Dema Timmons, NP-C [facility] [phone] 07/15/2014, 2:37 PM

## 2014-08-06 ENCOUNTER — Emergency Department (HOSPITAL_COMMUNITY)
Admission: EM | Admit: 2014-08-06 | Discharge: 2014-08-06 | Disposition: A | Discharge status: Home / Self Care | Payer: Self-pay | Attending: Emergency Medicine | Admitting: Emergency Medicine

## 2014-08-06 ENCOUNTER — Encounter (HOSPITAL_COMMUNITY): Payer: Self-pay

## 2014-08-06 DIAGNOSIS — Z8659 Personal history of other mental and behavioral disorders: Secondary | ICD-10-CM | POA: Insufficient documentation

## 2014-08-06 DIAGNOSIS — Z720 Tobacco use: Secondary | ICD-10-CM | POA: Insufficient documentation

## 2014-08-06 DIAGNOSIS — Z79899 Other long term (current) drug therapy: Secondary | ICD-10-CM | POA: Insufficient documentation

## 2014-08-06 DIAGNOSIS — L408 Other psoriasis: Secondary | ICD-10-CM | POA: Insufficient documentation

## 2014-08-06 DIAGNOSIS — Z8719 Personal history of other diseases of the digestive system: Secondary | ICD-10-CM | POA: Insufficient documentation

## 2014-08-06 DIAGNOSIS — J45909 Unspecified asthma, uncomplicated: Secondary | ICD-10-CM | POA: Insufficient documentation

## 2014-08-06 DIAGNOSIS — L409 Psoriasis, unspecified: Secondary | ICD-10-CM

## 2014-08-06 DIAGNOSIS — Z880 Allergy status to penicillin: Secondary | ICD-10-CM | POA: Insufficient documentation

## 2014-08-06 MED ORDER — TRIAMCINOLONE ACETONIDE 0.1 % EX CREA
1.0000 | TOPICAL_CREAM | Freq: Two times a day (BID) | CUTANEOUS | Status: DC
Start: 2014-08-06 — End: 2014-09-09

## 2014-08-06 NOTE — ED Notes (Signed)
Pt states he believes he got bit by something and started breaking out in rash all over body; pt presents with small bumps covering body. Pt denies pain; c/o itching.

## 2014-08-06 NOTE — Discharge Instructions (Signed)
Psoriasis Psoriasis is a common, long-lasting (chronic) inflammation of the skin. It affects both men and women equally, of all ages and all races. Psoriasis cannot be passed from person to person (not contagious). Psoriasis varies from mild to very severe. When severe, it can greatly affect your quality of life. Psoriasis is an inflammatory disorder affecting the skin as well as other organs including the joints (causing an arthritis). With psoriasis, the skin sheds its top layer of cells more rapidly than it does in someone without psoriasis. CAUSES  The cause of psoriasis is largely unknown. Genetics, your immune system, and the environment seem to play a role in causing psoriasis. Factors that can make psoriasis worse include:  Damage or trauma to the skin, such as cuts, scrapes, and sunburn. This damage often causes new areas of psoriasis (lesions).  Winter dryness and lack of sunlight.  Medicines such as lithium, beta-blockers, antimalarial drugs, ACE inhibitors, nonsteroidal anti-inflammatory drugs (ibuprofen, aspirin), and terbinafine. Let your caregiver know if you are taking any of these drugs.  Alcohol. Excessive alcohol use should be avoided if you have psoriasis. Drinking large amounts of alcohol can affect:  How well your psoriasis treatment works.  How safe your psoriasis treatment is.  Smoking. If you smoke, ask your caregiver for help to quit.  Stress.  Bacterial or viral infections.  Arthritis. Arthritis associated with psoriasis (psoriatic arthritis) affects less than 10% of patients with psoriasis. The arthritic intensity does not always match the skin psoriasis intensity. It is important to let your caregiver know if your joints hurt or if they are stiff. SYMPTOMS  The most common form of psoriasis begins with little red bumps that gradually become larger. The bumps begin to form scales that flake off easily. The lower layers of scales stick together. When these scales  are scratched or removed, the underlying skin is tender and bleeds easily. These areas then grow in size and may become large. Psoriasis often creates a rash that looks the same on both sides of the body (symmetrical). It often affects the elbows, knees, groin, genitals, arms, legs, scalp, and nails. Affected nails often have pitting, loosen, thicken, crumble, and are difficult to treat.  "Inverse psoriasis"occurs in the armpits, under breasts, in skin folds, and around the groin, buttocks, and genitals.  "Guttate psoriasis" generally occurs in children and young adults following a recent sore throat (strep throat). It begins with many small, red, scaly spots on the skin. It clears spontaneously in weeks or a few months without treatment. DIAGNOSIS  Psoriasis is diagnosed by physical exam. A tissue sample (biopsy) may also be taken. TREATMENT The treatment of psoriasis depends on your age, health, and living conditions.  Steroid (cortisone) creams, lotions, and ointments may be used. These treatments are associated with thinning of the skin, blood vessels that get larger (dilated), loss of skin pigmentation, and easy bruising. It is important to use these steroids as directed by your caregiver. Only treat the affected areas and not the normal, unaffected skin. People on long-term steroid treatment should wear a medical alert bracelet. Injections may be used in areas that are difficult to treat.  Scalp treatments are available as shampoos, solutions, sprays, foams, and oils. Avoid scratching the scalp and picking at the scales.  Anthralin medicine works well on areas that are difficult to treat. However, it stains clothes and skin and may cause temporary irritation.  Synthetic vitamin D (calcipotriene)can be used on small areas. It is available by prescription. The forms   of synthetic vitamin D available in health food stores do not help with psoriasis.  Coal tarsare available in various strengths  for psoriasis that is difficult to treat. They are one of the longest used treatments for difficult to treat psoriasis. However, they are messy to use.  Light therapy (UV therapy) can be carefully and professionally monitored in a dermatologist's office. Careful sunbathing is helpful for many people as directed by your caregiver. The exposure should be just long enough to cause a mild redness (erythema) of your skin. Avoid sunburn as this may make the condition worse. Sunscreen (SPF of 30 or higher) should be used to protect against sunburn. Cataracts, wrinkles, and skin aging are some of the harmful side effects of light therapy.  If creams (topical medicines) fail, there are several other options for systemic or oral medicines your caregiver can suggest. Psoriasis can sometimes be very difficult to treat. It can come and go. It is necessary to follow up with your caregiver regularly if your psoriasis is difficult to treat. Usually, with persistence you can get a good amount of relief. Maintaining consistent care is important. Do not change caregivers just because you do not see immediate results. It may take several trials to find the right combination of treatment for you. PREVENTING FLARE-UPS  Wear gloves while you wash dishes, while cleaning, and when you are outside in the cold.  If you have radiators, place a bowl of water or damp towel on the radiator. This will help put water back in the air. You can also use a humidifier to keep the air moist. Try to keep the humidity at about 60% in your home.  Apply moisturizer while your skin is still damp from bathing or showering. This traps water in the skin.  Avoid long, hot baths or showers. Keep soap use to a minimum. Soaps dry out the skin and wash away the protective oils. Use a fragrance free, dye free soap.  Drink enough water and fluids to keep your urine clear or pale yellow. Not drinking enough water depletes your skin's water  supply.  Turn off the heat at night and keep it low during the day. Cool air is less drying. SEEK MEDICAL CARE IF:  You have increasing pain in the affected areas.  You have uncontrolled bleeding in the affected areas.  You have increasing redness or warmth in the affected areas.  You start to have pain or stiffness in your joints.  You start feeling depressed about your condition.  You have a fever. Document Released: 07/19/2000 Document Revised: 10/14/2011 Document Reviewed: 01/14/2011 Exit[employer] Patient Information 2015 [employer]. This information is not intended to replace advice given to you by your health care provider. Make sure you discuss any questions you have with your health care provider.  

## 2014-08-06 NOTE — ED Provider Notes (Signed)
CSN: [id number]     Arrival date & time 08/06/14  4098 History   First MD Initiated Contact with Patient 08/06/14 0547     Chief Complaint  Patient presents with  . Rash    (Consider location/radiation/quality/duration/timing/severity/associated sxs/prior Treatment) Patient is a 19 y.o. male presenting with rash. The history is provided by the patient. No language interpreter was used.  Rash Location:  Full body Quality: itchiness   Quality: not blistering, not draining, not painful, not scaling, not swelling and not weeping   Severity:  Mild Onset quality:  Gradual Duration:  2 days Timing:  Constant Progression:  Spreading Chronicity:  New Context: not exposure to similar rash, not medications, not new detergent/soap and not sick contacts   Relieved by:  Nothing Ineffective treatments: Eczema cream, benadryl topical, lotions. Associated symptoms: no fever     Past Medical History  Diagnosis Date  . Asthma   . Seasonal allergies   . Eczema   . GERD (gastroesophageal reflux disease)   . ADHD (attention deficit hyperactivity disorder)   . Depression    Past Surgical History  Procedure Laterality Date  . Tonsillectomy    . Adenoidectomy    . Laparoscopic appendectomy N/A 08/19/2013    Procedure: APPENDECTOMY LAPAROSCOPIC;  Surgeon: Shelly Rubenstein, MD;  Location: [facility] OR;  Service: General;  Laterality: N/A;  . Appendectomy     Family History  Problem Relation Age of Onset  . Cancer Paternal Grandmother     ovarian   History  Substance Use Topics  . Smoking status: Current Every Day Smoker -- 0.25 packs/day    Types: Cigarettes  . Smokeless tobacco: Never Used  . Alcohol Use: Yes    Review of Systems  Constitutional: Negative for fever.  Skin: Positive for rash.  All other systems reviewed and are negative.     Allergies  Minocin; Shellfish allergy; Amoxicillin-pot clavulanate; and Penicillins  Home Medications   Prior to Admission medications    Medication Sig Start Date End Date Taking? Authorizing Provider  albuterol (PROVENTIL HFA;VENTOLIN HFA) 108 (90 BASE) MCG/ACT inhaler Inhale 2 puffs into the lungs every 6 (six) hours as needed for wheezing or shortness of breath.   Yes Historical Provider, MD  diphenhydrAMINE (BENADRYL) 25 MG tablet Take 25 mg by mouth every 6 (six) hours as needed for allergies.   Yes Historical Provider, MD  loratadine (CLARITIN) 10 MG tablet Take 10 mg by mouth daily as needed for allergies.   Yes Historical Provider, MD  dicyclomine (BENTYL) 20 MG tablet Take 1 tablet (20 mg total) by mouth 2 (two) times daily. Patient not taking: Reported on 07/15/2014 06/22/14   April K Palumbo-Rasch, MD  ibuprofen (ADVIL,MOTRIN) 600 MG tablet Take 1 tablet (600 mg total) by mouth every 6 (six) hours as needed. Patient not taking: Reported on 07/15/2014 01/02/14   Loren Racer, MD  polyethylene glycol powder [facility]) powder Take 17 g by mouth daily. Patient not taking: Reported on 07/15/2014 06/22/14   April K Palumbo-Rasch, MD  triamcinolone cream (KENALOG) 0.1 % Apply 1 application topically 2 (two) times daily. 08/06/14   Antony Madura, PA-C   BP 124/71 mmHg  Pulse 57  Temp(Src) 98.1 F (36.7 C) (Oral)  Resp 20  Ht 6' (1.829 m)  Wt 208 lb (94.348 kg)  BMI 28.20 kg/m2  SpO2 100%   Physical Exam  Constitutional: He is oriented to person, place, and time. He appears well-developed and well-nourished. No distress.  Nontoxic/nonseptic appearing  HENT:  Head: Normocephalic and atraumatic.  Oropharynx clear. No angioedema. Patient tolerating secretions without difficulty  Eyes: Conjunctivae and EOM are normal. No scleral icterus.  Neck: Normal range of motion.  Pulmonary/Chest: Effort normal. No respiratory distress. He has no wheezes.  Respirations even and unlabored  Musculoskeletal: Normal range of motion.  Neurological: He is alert and oriented to person, place, and time. He exhibits normal muscle tone.  Coordination normal.  Skin: Skin is warm and dry. Rash noted. He is not diaphoretic. No erythema. No pallor.  Patient with a dry appearing rash. Rash is scattered throughout body, but appears mostly present on extensor surfaces of b/l forearms/elbows. Rash is punctate papular/pustular without weeping or drainage. It is pruritic. No associated erythema or heat to touch.  Psychiatric: He has a normal mood and affect. His behavior is normal.  Nursing note and vitals reviewed.   ED Course  Procedures (including critical care time) Labs Review Labs Reviewed - No data to display  Imaging Review No results found.   EKG Interpretation None      MDM   Final diagnoses:  Psoriasis    19 year old male presents to the emergency department for further evaluation of rash. No respiratory compromise or angioedema. No contact with persons of similar rash, no new lotions/soaps/detergents. 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 Will tx with topical triamcinolone. Derm referral and return precautions provided. Patient agreeable to plan with no unaddressed concerns.   Filed Vitals:   08/06/14 0415 08/06/14 0428 08/06/14 0500 08/06/14 0530  BP: 123/79  117/75 124/71  Pulse: 63  57 57  Temp: 98.1 F (36.7 C)     TempSrc: Oral     Resp: 20     Height: 6' (1.829 m) 6' (1.829 m)    Weight: 208 lb (94.348 kg) 208 lb (94.348 kg)    SpO2: 100% 100% 100% 100%       Antony Madura, PA-C 08/06/14 1610  Olivia Mackie, MD 08/06/14 [phone]

## 2014-08-10 ENCOUNTER — Ambulatory Visit: Payer: Self-pay

## 2014-08-11 ENCOUNTER — Ambulatory Visit: Payer: Self-pay | Attending: Internal Medicine

## 2014-09-09 ENCOUNTER — Encounter (HOSPITAL_COMMUNITY): Payer: Self-pay | Admitting: Emergency Medicine

## 2014-09-09 ENCOUNTER — Emergency Department (HOSPITAL_COMMUNITY)
Admission: EM | Admit: 2014-09-09 | Discharge: 2014-09-09 | Disposition: A | Discharge status: Home / Self Care | Payer: Self-pay | Attending: Emergency Medicine | Admitting: Emergency Medicine

## 2014-09-09 ENCOUNTER — Emergency Department (HOSPITAL_COMMUNITY): Payer: Self-pay

## 2014-09-09 ENCOUNTER — Other Ambulatory Visit (HOSPITAL_COMMUNITY): Payer: Self-pay

## 2014-09-09 DIAGNOSIS — Y9289 Other specified places as the place of occurrence of the external cause: Secondary | ICD-10-CM | POA: Insufficient documentation

## 2014-09-09 DIAGNOSIS — Z8719 Personal history of other diseases of the digestive system: Secondary | ICD-10-CM | POA: Insufficient documentation

## 2014-09-09 DIAGNOSIS — J45909 Unspecified asthma, uncomplicated: Secondary | ICD-10-CM | POA: Insufficient documentation

## 2014-09-09 DIAGNOSIS — Y9389 Activity, other specified: Secondary | ICD-10-CM | POA: Insufficient documentation

## 2014-09-09 DIAGNOSIS — S299XXA Unspecified injury of thorax, initial encounter: Secondary | ICD-10-CM | POA: Insufficient documentation

## 2014-09-09 DIAGNOSIS — S298XXA Other specified injuries of thorax, initial encounter: Secondary | ICD-10-CM

## 2014-09-09 DIAGNOSIS — S022XXA Fracture of nasal bones, initial encounter for closed fracture: Secondary | ICD-10-CM

## 2014-09-09 DIAGNOSIS — Z720 Tobacco use: Secondary | ICD-10-CM | POA: Insufficient documentation

## 2014-09-09 DIAGNOSIS — H1132 Conjunctival hemorrhage, left eye: Secondary | ICD-10-CM

## 2014-09-09 DIAGNOSIS — Z880 Allergy status to penicillin: Secondary | ICD-10-CM | POA: Insufficient documentation

## 2014-09-09 DIAGNOSIS — Y998 Other external cause status: Secondary | ICD-10-CM | POA: Insufficient documentation

## 2014-09-09 DIAGNOSIS — F329 Major depressive disorder, single episode, unspecified: Secondary | ICD-10-CM | POA: Insufficient documentation

## 2014-09-09 DIAGNOSIS — Z79899 Other long term (current) drug therapy: Secondary | ICD-10-CM | POA: Insufficient documentation

## 2014-09-09 DIAGNOSIS — S0512XA Contusion of eyeball and orbital tissues, left eye, initial encounter: Secondary | ICD-10-CM | POA: Insufficient documentation

## 2014-09-09 DIAGNOSIS — Z872 Personal history of diseases of the skin and subcutaneous tissue: Secondary | ICD-10-CM | POA: Insufficient documentation

## 2014-09-09 MED ORDER — FLUORESCEIN SODIUM 1 MG OP STRP
1.0000 | ORAL_STRIP | Freq: Once | OPHTHALMIC | Status: AC
  Administered 2014-09-09: 1 via OPHTHALMIC
  Filled 2014-09-09: qty 1

## 2014-09-09 MED ORDER — TETRACAINE HCL 0.5 % OP SOLN
2.0000 [drp] | Freq: Once | OPHTHALMIC | Status: AC
  Administered 2014-09-09: 2 [drp] via OPHTHALMIC
  Filled 2014-09-09: qty 2

## 2014-09-09 MED ORDER — OXYCODONE-ACETAMINOPHEN 5-325 MG PO TABS
1.0000 | ORAL_TABLET | ORAL | Status: DC | PRN
Start: 2014-09-09 — End: 2015-06-10

## 2014-09-09 MED ORDER — OXYCODONE-ACETAMINOPHEN 5-325 MG PO TABS
2.0000 | ORAL_TABLET | Freq: Once | ORAL | Status: AC
  Administered 2014-09-09: 2 via ORAL
  Filled 2014-09-09: qty 2

## 2014-09-09 NOTE — ED Notes (Signed)
Family at bedside. 
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
## 2014-09-09 NOTE — Discharge Instructions (Signed)
Blunt Chest Trauma °Blunt chest trauma is an injury caused by a blow to the chest. These chest injuries can be very painful. Blunt chest trauma often results in bruised or broken (fractured) ribs. Most cases of bruised and fractured ribs from blunt chest traumas get better after 1 to 3 weeks of rest and pain medicine. Often, the soft tissue in the chest wall is also injured, causing pain and bruising. Internal organs, such as the heart and lungs, may also be injured. Blunt chest trauma can lead to serious medical problems. This injury requires immediate medical care. °CAUSES  °· Motor vehicle collisions. °· Falls. °· Physical violence. °· Sports injuries. °SYMPTOMS  °· Chest pain. The pain may be worse when you move or breathe deeply. °· Shortness of breath. °· Lightheadedness. °· Bruising. °· Tenderness. °· Swelling. °DIAGNOSIS  °Your caregiver will do a physical exam. X-rays may be taken to look for fractures. However, minor rib fractures may not show up on X-rays until a few days after the injury. If a more serious injury is suspected, further imaging tests may be done. This may include ultrasounds, computed tomography (CT) scans, or magnetic resonance imaging (MRI). °TREATMENT  °Treatment depends on the severity of your injury. Your caregiver may prescribe pain medicines and deep breathing exercises. °HOME CARE INSTRUCTIONS °· Limit your activities until you can move around without much pain. °· Do not do any strenuous work until your injury is healed. °· Put ice on the injured area. °¨ Put ice in a plastic bag. °¨ Place a towel between your skin and the bag. °¨ Leave the ice on for 15-20 minutes, 03-04 times a day. °· You may wear a rib belt as directed by your caregiver to reduce pain. °· Practice deep breathing as directed by your caregiver to keep your lungs clear. °· Only take over-the-counter or prescription medicines for pain, fever, or discomfort as directed by your caregiver. °SEEK IMMEDIATE MEDICAL  CARE IF:  °· You have increasing pain or shortness of breath. °· You cough up blood. °· You have nausea, vomiting, or abdominal pain. °· You have a fever. °· You feel dizzy, weak, or you faint. °MAKE SURE YOU: °· Understand these instructions. °· Will watch your condition. °· Will get help right away if you are not doing well or get worse. °Document Released: 08/29/2004 Document Revised: 10/14/2011 Document Reviewed: 05/08/2011 °ExitCare® Patient Information ©2015 [employer]. This information is not intended to replace advice given to you by your health care provider. Make sure you discuss any questions you have with your health care provider. ° ° °

## 2014-09-13 ENCOUNTER — Encounter: Payer: Self-pay | Admitting: Internal Medicine

## 2014-09-13 ENCOUNTER — Ambulatory Visit: Payer: Self-pay | Attending: Internal Medicine | Admitting: Internal Medicine

## 2014-09-13 VITALS — BP 119/78 | HR 67 | Temp 98.5°F | Resp 16 | Ht 72.0 in | Wt 205.0 lb

## 2014-09-13 DIAGNOSIS — F319 Bipolar disorder, unspecified: Secondary | ICD-10-CM | POA: Insufficient documentation

## 2014-09-13 DIAGNOSIS — T148 Other injury of unspecified body region: Secondary | ICD-10-CM

## 2014-09-13 DIAGNOSIS — F17200 Nicotine dependence, unspecified, uncomplicated: Secondary | ICD-10-CM | POA: Insufficient documentation

## 2014-09-13 DIAGNOSIS — F17210 Nicotine dependence, cigarettes, uncomplicated: Secondary | ICD-10-CM | POA: Insufficient documentation

## 2014-09-13 DIAGNOSIS — S0502XA Injury of conjunctiva and corneal abrasion without foreign body, left eye, initial encounter: Secondary | ICD-10-CM | POA: Insufficient documentation

## 2014-09-13 DIAGNOSIS — H1132 Conjunctival hemorrhage, left eye: Secondary | ICD-10-CM

## 2014-09-13 DIAGNOSIS — S60812A Abrasion of left wrist, initial encounter: Secondary | ICD-10-CM | POA: Insufficient documentation

## 2014-09-13 DIAGNOSIS — T148XXA Other injury of unspecified body region, initial encounter: Secondary | ICD-10-CM

## 2014-09-13 DIAGNOSIS — Z720 Tobacco use: Secondary | ICD-10-CM

## 2014-09-13 DIAGNOSIS — X58XXXA Exposure to other specified factors, initial encounter: Secondary | ICD-10-CM | POA: Insufficient documentation

## 2014-09-13 DIAGNOSIS — F1290 Cannabis use, unspecified, uncomplicated: Secondary | ICD-10-CM | POA: Insufficient documentation

## 2014-09-13 DIAGNOSIS — F329 Major depressive disorder, single episode, unspecified: Secondary | ICD-10-CM | POA: Insufficient documentation

## 2014-09-13 MED ORDER — TRIAMCINOLONE ACETONIDE 0.1 % EX CREA: 1.0000 "application " | TOPICAL_CREAM | Freq: Two times a day (BID) | CUTANEOUS | Status: DC
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
## 2014-09-13 NOTE — Patient Instructions (Signed)
Subconjunctival Hemorrhage °A subconjunctival hemorrhage is a bright red patch covering a portion of the white of the eye. The white part of the eye is called the sclera, and it is covered by a thin membrane called the conjunctiva. This membrane is clear, except for tiny blood vessels that you can see with the naked eye. When your eye is irritated or inflamed and becomes red, it is because the vessels in the conjunctiva are swollen. °Sometimes, a blood vessel in the conjunctiva can break and bleed. When this occurs, the blood builds up between the conjunctiva and the sclera, and spreads out to create a red area. The red spot may be very small at first. It may then spread to cover a larger part of the surface of the eye, or even all of the visible white part of the eye. °In almost all cases, the blood will go away and the eye will become white again. Before completely dissolving, however, the red area may spread. It may also become brownish-yellow in color before going away. If a lot of blood collects under the conjunctiva, it may look like a bulge on the surface of the eye. This looks scary, but it will also eventually flatten out and go away. Subconjunctival hemorrhages do not cause pain, but if swollen, may cause a feeling of irritation. There is no effect on vision.  °CAUSES  °· The most common cause is mild trauma (rubbing the eye, irritation). °· Subconjunctival hemorrhages can happen because of coughing or straining (lifting heavy objects), vomiting, or sneezing. °· In some cases, your doctor may want to check your blood pressure. High blood pressure can also cause a subconjunctival hemorrhage. °· Severe trauma or blunt injuries. °· Diseases that affect blood clotting (hemophilia, leukemia). °· Abnormalities of blood vessels behind the eye (carotid cavernous sinus fistula). °· Tumors behind the eye. °· Certain drugs (aspirin, Coumadin, heparin). °· Recent eye surgery. °HOME CARE INSTRUCTIONS  °· Do not worry  about the appearance of your eye. You may continue your usual activities. °· Often, follow-up is not necessary. °SEEK MEDICAL CARE IF:  °· Your eye becomes painful. °· The bleeding does not disappear within 3 weeks. °· Bleeding occurs elsewhere, for example, under the skin, in the mouth, or in the other eye. °· You have recurring subconjunctival hemorrhages. °SEEK IMMEDIATE MEDICAL CARE IF:  °· Your vision changes or you have difficulty seeing. °· You develop a severe headache, persistent vomiting, confusion, or abnormal drowsiness (lethargy). °· Your eye seems to bulge or protrude from the eye socket. °· You notice the sudden appearance of bruises or have spontaneous bleeding elsewhere on your body. °Document Released: 07/22/2005 Document Revised: 12/06/2013 Document Reviewed: 06/19/2009 °ExitCare® Patient Information ©2015 [employer]. This information is not intended to replace advice given to you by your health care provider. Make sure you discuss any questions you have with your health care provider. ° °

## 2015-01-07 ENCOUNTER — Emergency Department (HOSPITAL_COMMUNITY)
Admission: EM | Admit: 2015-01-07 | Discharge: 2015-01-07 | Disposition: A | Discharge status: Home / Self Care | Payer: 59 | Attending: Emergency Medicine | Admitting: Emergency Medicine

## 2015-01-07 ENCOUNTER — Emergency Department (HOSPITAL_COMMUNITY): Payer: 59

## 2015-01-07 ENCOUNTER — Encounter (HOSPITAL_COMMUNITY): Payer: Self-pay | Admitting: Emergency Medicine

## 2015-01-07 DIAGNOSIS — J45901 Unspecified asthma with (acute) exacerbation: Secondary | ICD-10-CM | POA: Insufficient documentation

## 2015-01-07 DIAGNOSIS — Z8719 Personal history of other diseases of the digestive system: Secondary | ICD-10-CM | POA: Insufficient documentation

## 2015-01-07 DIAGNOSIS — F329 Major depressive disorder, single episode, unspecified: Secondary | ICD-10-CM | POA: Diagnosis not present

## 2015-01-07 DIAGNOSIS — Z872 Personal history of diseases of the skin and subcutaneous tissue: Secondary | ICD-10-CM | POA: Insufficient documentation

## 2015-01-07 DIAGNOSIS — Z720 Tobacco use: Secondary | ICD-10-CM | POA: Insufficient documentation

## 2015-01-07 DIAGNOSIS — F909 Attention-deficit hyperactivity disorder, unspecified type: Secondary | ICD-10-CM | POA: Diagnosis not present

## 2015-01-07 DIAGNOSIS — J40 Bronchitis, not specified as acute or chronic: Secondary | ICD-10-CM

## 2015-01-07 DIAGNOSIS — Z880 Allergy status to penicillin: Secondary | ICD-10-CM | POA: Insufficient documentation

## 2015-01-07 DIAGNOSIS — R05 Cough: Secondary | ICD-10-CM | POA: Diagnosis present

## 2015-01-07 DIAGNOSIS — Z79899 Other long term (current) drug therapy: Secondary | ICD-10-CM | POA: Insufficient documentation

## 2015-01-07 MED ORDER — PREDNISONE 20 MG PO TABS
60.0000 mg | ORAL_TABLET | Freq: Once | ORAL | Status: AC
  Administered 2015-01-07: 60 mg via ORAL
  Filled 2015-01-07: qty 3

## 2015-01-07 MED ORDER — AZITHROMYCIN 250 MG PO TABS: 250.0000 mg | ORAL_TABLET | Freq: Every day | ORAL | Status: DC

## 2015-01-07 MED ORDER — IPRATROPIUM-ALBUTEROL 0.5-2.5 (3) MG/3ML IN SOLN
3.0000 mL | Freq: Once | RESPIRATORY_TRACT | Status: AC
  Administered 2015-01-07: 3 mL via RESPIRATORY_TRACT
  Filled 2015-01-07: qty 3

## 2015-01-07 MED ORDER — PREDNISONE 10 MG PO TABS: ORAL_TABLET | ORAL | Status: DC

## 2015-01-07 MED ORDER — PSEUDOEPHEDRINE HCL 30 MG PO TABS: 30.0000 mg | ORAL_TABLET | ORAL | Status: DC | PRN

## 2015-01-07 NOTE — ED Provider Notes (Signed)
CSN: [id number]     Arrival date & time 01/07/15  0809 History   First MD Initiated Contact with Patient 01/07/15 [phone]     Chief Complaint  Patient presents with  . Cough  . Fever  . Headache  . Chest Pain  . Nasal Congestion     (Consider location/radiation/quality/duration/timing/severity/associated sxs/prior Treatment) [name] is a 19 y.o. male with hx of asthma, seasonal allergies, depression, presents to ED with complain of cough, congestion, headache, fever, chills for 2 months. States symptoms worsened in the last several days. States checked his temp once a week ago when he felt hot and it was 102. Patient states his chest feels tight. Has cough that is productive with thick sputum. States pain in the chest when he is coughing. He has tried taking Benadryl, and over-the-counter flu medication with no improvement.   Past Medical History  Diagnosis Date  . Asthma   . Seasonal allergies   . Eczema   . GERD (gastroesophageal reflux disease)   . ADHD (attention deficit hyperactivity disorder)   . Depression    Past Surgical History  Procedure Laterality Date  . Tonsillectomy    . Adenoidectomy    . Laparoscopic appendectomy N/A 08/19/2013    Procedure: APPENDECTOMY LAPAROSCOPIC;  Surgeon: Shelly Rubenstein, MD;  Location: [facility] OR;  Service: General;  Laterality: N/A;  . Appendectomy     Family History  Problem Relation Age of Onset  . Cancer Paternal Grandmother     ovarian   History  Substance Use Topics  . Smoking status: Current Every Day Smoker -- 0.25 packs/day    Types: Cigarettes  . Smokeless tobacco: Never Used  . Alcohol Use: Yes    Review of Systems  Constitutional: Positive for fever and chills.  Respiratory: Positive for cough, chest tightness and wheezing. Negative for shortness of breath.   Cardiovascular: Positive for chest pain. Negative for palpitations and leg swelling.  Gastrointestinal: Negative for nausea, vomiting and diarrhea.   Musculoskeletal: Positive for myalgias and arthralgias. Negative for neck pain and neck stiffness.  Skin: Negative for rash.  Allergic/Immunologic: Negative for immunocompromised state.  Neurological: Positive for headaches. Negative for dizziness, weakness, light-headedness and numbness.  All other systems reviewed and are negative.     Allergies  Minocin; Shellfish allergy; Amoxicillin-pot clavulanate; and Penicillins  Home Medications   Prior to Admission medications   Medication Sig Start Date End Date Taking? Authorizing Provider  albuterol (PROVENTIL HFA;VENTOLIN HFA) 108 (90 BASE) MCG/ACT inhaler Inhale 2 puffs into the lungs every 6 (six) hours as needed for wheezing or shortness of breath.    Historical Provider, MD  buPROPion (WELLBUTRIN XL) 150 MG 24 hr tablet Take 150 mg by mouth daily.    Historical Provider, MD  diphenhydrAMINE (BENADRYL) 25 MG tablet Take 25 mg by mouth every 6 (six) hours as needed for allergies.    Historical Provider, MD  FLUoxetine (PROZAC) 10 MG capsule Take 10 mg by mouth daily.    Historical Provider, MD  loratadine (CLARITIN) 10 MG tablet Take 10 mg by mouth daily as needed for allergies.    Historical Provider, MD  oxyCODONE-acetaminophen (PERCOCET/ROXICET) 5-325 MG per tablet Take 1 tablet by mouth every 4 (four) hours as needed for severe pain. Patient not taking: Reported on 09/13/2014 09/09/14   Zadie Rhine, MD  QUEtiapine Fumarate (SEROQUEL PO) Take 1 tablet by mouth daily.    Historical Provider, MD  triamcinolone cream (KENALOG) 0.1 % Apply 1 application  topically 2 (two) times daily. 09/13/14   Ambrose FinlandValerie A Keck, NP   BP 126/76 mmHg  Pulse 69  Resp 19  Ht 6\' 1"  (1.854 m)  Wt 216 lb (97.977 kg)  BMI 28.50 kg/m2  SpO2 99% Physical Exam  Constitutional: He appears well-developed and well-nourished. No distress.  HENT:  Head: Normocephalic and atraumatic.  Eyes: Conjunctivae are normal.  Neck: Neck supple.  Cardiovascular: Normal rate,  regular rhythm and normal heart sounds.   Pulmonary/Chest: Effort normal. No respiratory distress. He has wheezes. He has no rales.  Musculoskeletal: He exhibits no edema.  Neurological: He is alert.  Skin: Skin is warm and dry.  Nursing note and vitals reviewed.   ED Course  Procedures (including critical care time) Labs Review Labs Reviewed - No data to display  Imaging Review Dg Chest 2 View  01/07/2015   CLINICAL DATA:  Patient states has had congestion, cough, sob, fever, and chest pain for 2weeks. Hx of allergies and asthma.  EXAM: CHEST  2 VIEW  COMPARISON:  09/09/2014  FINDINGS: The heart size and mediastinal contours are within normal limits. Both lungs are clear. No pleural effusion or pneumothorax. The visualized skeletal structures are unremarkable.  IMPRESSION: Normal chest radiographs.   Electronically Signed   By: Amie Portlandavid  Ormond M.D.   On: 01/07/2015 09:28     EKG Interpretation None      MDM   Final diagnoses:  Bronchitis    Pt with expiratory wheezes bilaterally. Cough, nasal congestion, he states for 2 weeks. Will give a neb, cxr ordered, prednisone 60mg  given.    10:11 AM Pt's chest xray is normal. Wheezing resolved. Most likely bronchitis vs asthma exacerbation vs allergies. Plan to dc home with close follow up. Inhaler, sudafed, steroids.   Filed Vitals:   01/07/15 0924 01/07/15 0925 01/07/15 0926 01/07/15 0930  BP: 129/71   125/62  Pulse:  70  66  Temp:   98 F (36.7 C)   TempSrc:   Oral   Resp:      Height:      Weight:      SpO2:  100%  100%       Jaynie Crumbleatyana Kieli Golladay, PA-C 01/07/15 1156  Elwin MochaBlair Walden, MD 01/07/15 1639

## 2015-01-07 NOTE — Discharge Instructions (Signed)
Take Sudafed for congestion. Return as as prescribed until all gone, next dose tomorrow. Use your inhaler 2 puffs every 4 hours. Take Zithromax as prescribed until all gone. Please follow-up with your primary care doctor.  Acute Bronchitis Bronchitis is inflammation of the airways that extend from the windpipe into the lungs (bronchi). The inflammation often causes mucus to develop. This leads to a cough, which is the most common symptom of bronchitis.  In acute bronchitis, the condition usually develops suddenly and goes away over time, usually in a couple weeks. Smoking, allergies, and asthma can make bronchitis worse. Repeated episodes of bronchitis may cause further lung problems.  CAUSES Acute bronchitis is most often caused by the same virus that causes a cold. The virus can spread from person to person (contagious) through coughing, sneezing, and touching contaminated objects. SIGNS AND SYMPTOMS   Cough.   Fever.   Coughing up mucus.   Body aches.   Chest congestion.   Chills.   Shortness of breath.   Sore throat.  DIAGNOSIS  Acute bronchitis is usually diagnosed through a physical exam. Your health care provider will also ask you questions about your medical history. Tests, such as chest X-rays, are sometimes done to rule out other conditions.  TREATMENT  Acute bronchitis usually goes away in a couple weeks. Oftentimes, no medical treatment is necessary. Medicines are sometimes given for relief of fever or cough. Antibiotic medicines are usually not needed but may be prescribed in certain situations. In some cases, an inhaler may be recommended to help reduce shortness of breath and control the cough. A cool mist vaporizer may also be used to help thin bronchial secretions and make it easier to clear the chest.  HOME CARE INSTRUCTIONS  Get plenty of rest.   Drink enough fluids to keep your urine clear or pale yellow (unless you have a medical condition that requires  fluid restriction). Increasing fluids may help thin your respiratory secretions (sputum) and reduce chest congestion, and it will prevent dehydration.   Take medicines only as directed by your health care provider.  If you were prescribed an antibiotic medicine, finish it all even if you start to feel better.  Avoid smoking and secondhand smoke. Exposure to cigarette smoke or irritating chemicals will make bronchitis worse. If you are a smoker, consider using nicotine gum or skin patches to help control withdrawal symptoms. Quitting smoking will help your lungs heal faster.   Reduce the chances of another bout of acute bronchitis by washing your hands frequently, avoiding people with cold symptoms, and trying not to touch your hands to your mouth, nose, or eyes.   Keep all follow-up visits as directed by your health care provider.  SEEK MEDICAL CARE IF: Your symptoms do not improve after 1 week of treatment.  SEEK IMMEDIATE MEDICAL CARE IF:  You develop an increased fever or chills.   You have chest pain.   You have severe shortness of breath.  You have bloody sputum.   You develop dehydration.  You faint or repeatedly feel like you are going to pass out.  You develop repeated vomiting.  You develop a severe headache. MAKE SURE YOU:   Understand these instructions.  Will watch your condition.  Will get help right away if you are not doing well or get worse. Document Released: 08/29/2004 Document Revised: 12/06/2013 Document Reviewed: 01/12/2013 [facility] Patient Information 2015 [place], [place]. This information is not intended to replace advice given to you by your health care provider.  Make sure you discuss any questions you have with your health care provider. ° °

## 2015-01-07 NOTE — ED Notes (Signed)
Pt. Stated, I've had a cough for about 2 months, then fever headache and chest hurting from coughing so much

## 2015-05-20 ENCOUNTER — Emergency Department (HOSPITAL_COMMUNITY)
Admission: EM | Admit: 2015-05-20 | Discharge: 2015-05-20 | Disposition: A | Discharge status: Home / Self Care | Payer: 59 | Attending: Emergency Medicine | Admitting: Emergency Medicine

## 2015-05-20 ENCOUNTER — Encounter (HOSPITAL_COMMUNITY): Payer: Self-pay | Admitting: Emergency Medicine

## 2015-05-20 DIAGNOSIS — F329 Major depressive disorder, single episode, unspecified: Secondary | ICD-10-CM | POA: Diagnosis not present

## 2015-05-20 DIAGNOSIS — Z8719 Personal history of other diseases of the digestive system: Secondary | ICD-10-CM | POA: Diagnosis not present

## 2015-05-20 DIAGNOSIS — R21 Rash and other nonspecific skin eruption: Secondary | ICD-10-CM | POA: Insufficient documentation

## 2015-05-20 DIAGNOSIS — Z720 Tobacco use: Secondary | ICD-10-CM | POA: Insufficient documentation

## 2015-05-20 DIAGNOSIS — J45909 Unspecified asthma, uncomplicated: Secondary | ICD-10-CM | POA: Diagnosis not present

## 2015-05-20 DIAGNOSIS — Z872 Personal history of diseases of the skin and subcutaneous tissue: Secondary | ICD-10-CM | POA: Insufficient documentation

## 2015-05-20 DIAGNOSIS — Z880 Allergy status to penicillin: Secondary | ICD-10-CM | POA: Diagnosis not present

## 2015-05-20 DIAGNOSIS — Z79899 Other long term (current) drug therapy: Secondary | ICD-10-CM | POA: Diagnosis not present

## 2015-05-20 MED ORDER — PREDNISONE 20 MG PO TABS: 40.0000 mg | ORAL_TABLET | Freq: Every day | ORAL | Status: DC

## 2015-05-20 MED ORDER — DIPHENHYDRAMINE HCL 25 MG PO TABS: 25.0000 mg | ORAL_TABLET | Freq: Four times a day (QID) | ORAL | Status: DC | PRN

## 2015-05-20 MED ORDER — TRIAMCINOLONE ACETONIDE 0.1 % EX CREA: 1.0000 "application " | TOPICAL_CREAM | Freq: Two times a day (BID) | CUTANEOUS | Status: DC

## 2015-05-20 NOTE — ED Notes (Signed)
Rash started on neck about 1 1/2 months ago.  Rash itches.  Reports rash has spread to face, torso and legs

## 2015-05-20 NOTE — Discharge Instructions (Signed)
Take the prescribed medication as directed. Follow-up with your primary care physician.  If no improving, may need to see dermatology. Return to the ED for new or worsening symptoms.

## 2015-05-20 NOTE — ED Provider Notes (Signed)
CSN: [id number]     Arrival date & time 05/20/15  0908 History  By signing my name below, I, [name], attest that this documentation has been prepared under the direction and in the presence of Sharilyn SitesLisa Kirstein Baxley, PA-C Electronically Signed: [name], ED Scribe. 05/20/2015. 9:36 AM.    Chief Complaint  Patient presents with  . Rash   The history is provided by the patient. No language interpreter was used.   HPI Comments: Phillip Leach is a 19 y.o. male with a h/o eczema and asthma who presents to the Emergency Department complaining of intermittent, red, raised, itchy rash onset 1.5 months ago. He states the rash started on his neck and has begun to spread to his back, torso, face and arms.  He denies any known bug bites. Pt endorses that when he sweats the rash typically gets worse. He has applied hydrocortisone cream to the area with no relief. Pt denies any new soaps, lotions, detergents or foods. He denies any fevers or other associated symptoms.  No fever, chills, sweats.  No hx of HIV, DM, MRSA.    Past Medical History  Diagnosis Date  . Asthma   . Seasonal allergies   . Eczema   . GERD (gastroesophageal reflux disease)   . ADHD (attention deficit hyperactivity disorder)   . Depression    Past Surgical History  Procedure Laterality Date  . Tonsillectomy    . Adenoidectomy    . Laparoscopic appendectomy N/A 08/19/2013    Procedure: APPENDECTOMY LAPAROSCOPIC;  Surgeon: Shelly Rubensteinouglas A Blackman, MD;  Location: [facility] OR;  Service: General;  Laterality: N/A;  . Appendectomy     Family History  Problem Relation Age of Onset  . Cancer Paternal Grandmother     ovarian   Social History  Substance Use Topics  . Smoking status: Current Every Day Smoker -- 0.25 packs/day    Types: Cigarettes  . Smokeless tobacco: Never Used  . Alcohol Use: Yes    Review of Systems  Constitutional: Negative for fever.  Skin: Positive for rash.  All other systems reviewed and are  negative.     Allergies  Minocin; Shellfish allergy; Amoxicillin-pot clavulanate; and Penicillins  Home Medications   Prior to Admission medications   Medication Sig Start Date End Date Taking? Authorizing Provider  albuterol (PROVENTIL HFA;VENTOLIN HFA) 108 (90 BASE) MCG/ACT inhaler Inhale 2 puffs into the lungs every 6 (six) hours as needed for wheezing or shortness of breath.    Historical Provider, MD  azithromycin (ZITHROMAX) 250 MG tablet Take 1 tablet (250 mg total) by mouth daily. Take first 2 tablets together, then 1 every day until finished. 01/07/15   Tatyana Kirichenko, PA-C  buPROPion (WELLBUTRIN XL) 150 MG 24 hr tablet Take 150 mg by mouth daily.    Historical Provider, MD  diphenhydrAMINE (BENADRYL) 25 MG tablet Take 25 mg by mouth every 6 (six) hours as needed for allergies.    Historical Provider, MD  FLUoxetine (PROZAC) 10 MG capsule Take 10 mg by mouth daily.    Historical Provider, MD  loratadine (CLARITIN) 10 MG tablet Take 10 mg by mouth daily as needed for allergies.    Historical Provider, MD  oxyCODONE-acetaminophen (PERCOCET/ROXICET) 5-325 MG per tablet Take 1 tablet by mouth every 4 (four) hours as needed for severe pain. Patient not taking: Reported on 09/13/2014 09/09/14   Zadie Rhineonald Wickline, MD  predniSONE (DELTASONE) 10 MG tablet Take 5 tab day 1, take 4 tab day 2, take 3 tab day  3, take 2 tab day 4, and take 1 tab day 5 01/07/15   Tatyana Kirichenko, PA-C  pseudoephedrine (SUDAFED) 30 MG tablet Take 1 tablet (30 mg total) by mouth every 4 (four) hours as needed for congestion. 01/07/15   Tatyana Kirichenko, PA-C  QUEtiapine Fumarate (SEROQUEL PO) Take 1 tablet by mouth daily.    Historical Provider, MD  triamcinolone cream (KENALOG) 0.1 % Apply 1 application topically 2 (two) times daily. 09/13/14   Ambrose Finland, NP   Triage Vitals: BP 124/79 mmHg  Pulse 72  Temp(Src) 97.8 F (36.6 C) (Oral)  Resp 20  Ht 6' (1.829 m)  Wt 223 lb (101.152 kg)  BMI 30.24 kg/m2  SpO2  100%  Physical Exam  Constitutional: He is oriented to person, place, and time. He appears well-developed and well-nourished. No distress.  HENT:  Head: Normocephalic and atraumatic.  Mouth/Throat: Uvula is midline, oropharynx is clear and moist and mucous membranes are normal. No oropharyngeal exudate, posterior oropharyngeal edema, posterior oropharyngeal erythema or tonsillar abscesses.  No oral lesions or airway compromise  Eyes: Conjunctivae and EOM are normal. Pupils are equal, round, and reactive to light.  Neck: Normal range of motion. Neck supple.  Cardiovascular: Normal rate, regular rhythm and normal heart sounds.   Pulmonary/Chest: Effort normal and breath sounds normal. No respiratory distress. He has no wheezes.  Musculoskeletal: Normal range of motion. He exhibits no edema.  Neurological: He is alert and oriented to person, place, and time.  Skin: Skin is warm and dry. Rash noted. He is not diaphoretic.  Rash noted to posterior neck, shoulder, and back; appears to be scattered dry patches without signs of superimposed infection; no lesions on palms/soles; no erythema or induration to suggest cellulitis  Psychiatric: He has a normal mood and affect.  Nursing note and vitals reviewed.   ED Course  Procedures (including critical care time)  DIAGNOSTIC STUDIES: Oxygen Saturation is 100% on RA, normal by my interpretation.    COORDINATION OF CARE: 9:37 AM- Will order pt rx of prednisone and Benadryl and have pt follow up with dermatology if the rash continues to progress.  Pt advised of plan for treatment and pt agrees.  Labs Review Labs Reviewed - No data to display  Imaging Review No results found.    EKG Interpretation None      MDM   Final diagnoses:  Rash   19 y.o. Here with rash for the past month and half.  Patient afebrile, non-toxic.  Rash appears consistent with scattered patches of psoriasis-- based on chart review seems to have a history of this.   There are no lesions on palms/soles.  No signs of superimposed infection.  No oral lesions or airway compromise.  Will start kenalog cream and steroids.  Benadryl recommended for itching.  FU with PCP, may need referral to dermatology if not improving with treatment.  Discussed plan with patient, he/she acknowledged understanding and agreed with plan of care.  Return precautions given for new or worsening symptoms.  I personally performed the services described in this documentation, which was scribed in my presence. The recorded information has been reviewed and is accurate.  Garlon Hatchet, PA-C 05/20/15 1001  Margarita Grizzle, MD 05/20/15 1019

## 2015-06-10 ENCOUNTER — Emergency Department (HOSPITAL_COMMUNITY)
Admission: EM | Admit: 2015-06-10 | Discharge: 2015-06-10 | Disposition: A | Discharge status: Home / Self Care | Payer: 59 | Attending: Emergency Medicine | Admitting: Emergency Medicine

## 2015-06-10 ENCOUNTER — Encounter (HOSPITAL_COMMUNITY): Payer: Self-pay

## 2015-06-10 DIAGNOSIS — J45909 Unspecified asthma, uncomplicated: Secondary | ICD-10-CM | POA: Insufficient documentation

## 2015-06-10 DIAGNOSIS — L988 Other specified disorders of the skin and subcutaneous tissue: Secondary | ICD-10-CM | POA: Insufficient documentation

## 2015-06-10 DIAGNOSIS — Z8659 Personal history of other mental and behavioral disorders: Secondary | ICD-10-CM | POA: Insufficient documentation

## 2015-06-10 DIAGNOSIS — Z8719 Personal history of other diseases of the digestive system: Secondary | ICD-10-CM | POA: Insufficient documentation

## 2015-06-10 DIAGNOSIS — Z87891 Personal history of nicotine dependence: Secondary | ICD-10-CM | POA: Diagnosis not present

## 2015-06-10 DIAGNOSIS — M5442 Lumbago with sciatica, left side: Secondary | ICD-10-CM | POA: Diagnosis not present

## 2015-06-10 DIAGNOSIS — M79605 Pain in left leg: Secondary | ICD-10-CM | POA: Diagnosis present

## 2015-06-10 DIAGNOSIS — Z880 Allergy status to penicillin: Secondary | ICD-10-CM | POA: Diagnosis not present

## 2015-06-10 MED ORDER — IBUPROFEN 800 MG PO TABS: 800.0000 mg | ORAL_TABLET | Freq: Three times a day (TID) | ORAL | Status: DC | PRN

## 2015-06-10 MED ORDER — METHOCARBAMOL 500 MG PO TABS: 500.0000 mg | ORAL_TABLET | Freq: Four times a day (QID) | ORAL | Status: DC | PRN

## 2015-06-10 MED ORDER — KETOROLAC TROMETHAMINE 60 MG/2ML IM SOLN
60.0000 mg | Freq: Once | INTRAMUSCULAR | Status: AC
  Administered 2015-06-10: 60 mg via INTRAMUSCULAR
  Filled 2015-06-10: qty 2

## 2015-06-10 MED ORDER — TRIAMCINOLONE ACETONIDE 0.1 % EX CREA: 1.0000 "application " | TOPICAL_CREAM | Freq: Two times a day (BID) | CUTANEOUS | Status: DC

## 2015-06-10 MED ORDER — KETOTIFEN FUMARATE 0.025 % OP SOLN: 1.0000 [drp] | Freq: Two times a day (BID) | OPHTHALMIC | Status: DC | PRN

## 2015-06-10 NOTE — ED Notes (Signed)
Pt states he was here for rash 3 weeks ago and rash has come back worse;Rash is located on left side of neck and on outside of left eye; Pt states he also has had trouble walking and pain in bilateral legs with shooting pains from his butt to bottom of feet; Pt ambulated well w/o assistance to the treatment room; Pt c/o pain 10/10 on arrival; pt a&ox 4 on arrival;

## 2015-06-10 NOTE — ED Notes (Signed)
PA at bedside.

## 2015-06-10 NOTE — ED Provider Notes (Signed)
CSN: [id number]     Arrival date & time 06/10/15  0527 History   First MD Initiated Contact with Patient 06/10/15 0600     Chief Complaint  Patient presents with  . Rash  . Leg Pain     (Consider location/radiation/quality/duration/timing/severity/associated sxs/prior Treatment) The history is provided by the patient.     Pt presents with new low back pain that radiates down both legs, L>R.  The pain began 3 days ago, described as burning like after exercise.  States that his legs are trembling with use.   Denies fevers, abdominal pain, loss of control of bowel or bladder, weakness of numbness of the extremities, saddle anesthesia, bowel, urinary, or testicular complaints.   Denies any trauma, falls, heavy lifting, change in routine.  Also notes he has chronic skin problems, seen in ED recently for same.  States that the medication helped and then when he ran out of medication the rash came back in bigger patches.  It is itchy.  Located over left eye and anterior neck. States the left eye lid margin is itchy.  Denies eye pain, visual changes, discharge, eye trauma.  States he had an eczema lesions near his eye recently that has resolved.   Past Medical History  Diagnosis Date  . Asthma   . Seasonal allergies   . Eczema   . GERD (gastroesophageal reflux disease)   . ADHD (attention deficit hyperactivity disorder)   . Depression    Past Surgical History  Procedure Laterality Date  . Tonsillectomy    . Adenoidectomy    . Laparoscopic appendectomy N/A 08/19/2013    Procedure: APPENDECTOMY LAPAROSCOPIC;  Surgeon: Shelly Rubenstein, MD;  Location: [facility] OR;  Service: General;  Laterality: N/A;  . Appendectomy     Family History  Problem Relation Age of Onset  . Cancer Paternal Grandmother     ovarian   Social History  Substance Use Topics  . Smoking status: Former Smoker -- 0.25 packs/day    Types: Cigarettes    Quit date: 04/10/2015  . Smokeless tobacco: Never Used  . Alcohol  Use: Yes    Review of Systems  All other systems reviewed and are negative.     Allergies  Minocin; Shellfish allergy; Amoxicillin-pot clavulanate; and Penicillins  Home Medications   Prior to Admission medications   Medication Sig Start Date End Date Taking? Authorizing Provider  azithromycin (ZITHROMAX) 250 MG tablet Take 1 tablet (250 mg total) by mouth daily. Take first 2 tablets together, then 1 every day until finished. Patient not taking: Reported on 06/10/2015 01/07/15   Tatyana Kirichenko, PA-C  diphenhydrAMINE (BENADRYL) 25 MG tablet Take 1 tablet (25 mg total) by mouth every 6 (six) hours as needed for itching (Rash). Patient not taking: Reported on 06/10/2015 05/20/15   Garlon Hatchet, PA-C  oxyCODONE-acetaminophen (PERCOCET/ROXICET) 5-325 MG per tablet Take 1 tablet by mouth every 4 (four) hours as needed for severe pain. Patient not taking: Reported on 09/13/2014 09/09/14   Zadie Rhine, MD  predniSONE (DELTASONE) 20 MG tablet Take 2 tablets (40 mg total) by mouth daily. Take 40 mg by mouth daily for 3 days, then  by mouth daily for 3 days, then  daily for 3 days Patient not taking: Reported on 06/10/2015 05/20/15   Garlon Hatchet, PA-C  pseudoephedrine (SUDAFED) 30 MG tablet Take 1 tablet (30 mg total) by mouth every 4 (four) hours as needed for congestion. Patient not taking: Reported on 06/10/2015 01/07/15   Jaynie Crumble,  PA-C  triamcinolone cream (KENALOG) 0.1 % Apply 1 application topically 2 (two) times daily. Patient not taking: Reported on 06/10/2015 05/20/15   Garlon HatchetLisa M Sanders, PA-C   BP 116/61 mmHg  Pulse 70  Temp(Src) 97.7 F (36.5 C) (Oral)  Resp 18  Ht 6' (1.829 m)  Wt 230 lb (104.327 kg)  BMI 31.19 kg/m2  SpO2 100% Physical Exam  Constitutional: He appears well-developed and well-nourished. No distress.  HENT:  Head: Normocephalic and atraumatic.  Eyes: Conjunctivae, EOM and lids are normal. Pupils are equal, round, and reactive to light. Right  eye exhibits no discharge. Left eye exhibits no chemosis, no discharge, no exudate and no hordeolum. No foreign body present in the left eye. Left conjunctiva is not injected. No scleral icterus.  No lesions, no skin changes noted at eyelid margin  Neck: Neck supple.  Cardiovascular: Normal rate and regular rhythm.   Pulmonary/Chest: Effort normal and breath sounds normal. No respiratory distress. He has no wheezes. He has no rales.  Abdominal: Soft. He exhibits no distension and no mass. There is no tenderness. There is no rebound and no guarding.  Musculoskeletal:  Spine nontender, no crepitus, or stepoffs. Lower extremities:  Strength 5/5, sensation intact though slightly decreased through left leg, distal pulses intact.    Gait is normal  Neurological: He is alert. He exhibits normal muscle tone.  Skin: He is not diaphoretic.  Dry scaly patch over anterior neck. No erythema, edema, warmth, discharge.    Nursing note and vitals reviewed.   ED Course  Procedures (including critical care time) Labs Review Labs Reviewed - No data to display  Imaging Review No results found. I have personally reviewed and evaluated these images and lab results as part of my medical decision-making.   EKG Interpretation None      MDM   Final diagnoses:  Left-sided low back pain with left-sided sciatica    Afebrile, nontoxic patient with low back pain with radiation into the legs x 3 days.  No red flags.  Discussed strict return precautions.   D/C home with refill kenalog cream, tobaxin, ibuprofen, zaditor.  Encouraged PCP follow up.  Discussed result, findings, treatment, and follow up  with patient.  Pt given return precautions.  Pt verbalizes understanding and agrees with plan.        Trixie Dredgemily Khristian Seals, PA-C 06/10/15 1058  Loren Raceravid Yelverton, MD 06/11/15 [phone]

## 2015-06-10 NOTE — Discharge Instructions (Signed)
Read the information below.  Use the prescribed medication as directed.  Please discuss all new medications with your pharmacist.  You may return to the Emergency Department at any time for worsening condition or any new symptoms that concern you.    If you develop fevers, loss of control of bowel or bladder, weakness or numbness in your legs, or are unable to walk, return to the ER for a recheck.    Sciatica Sciatica is pain, weakness, numbness, or tingling along the path of the sciatic nerve. The nerve starts in the lower back and runs down the back of each leg. The nerve controls the muscles in the lower leg and in the back of the knee, while also providing sensation to the back of the thigh, lower leg, and the sole of your foot. Sciatica is a symptom of another medical condition. For instance, nerve damage or certain conditions, such as a herniated disk or bone spur on the spine, pinch or put pressure on the sciatic nerve. This causes the pain, weakness, or other sensations normally associated with sciatica. Generally, sciatica only affects one side of the body. CAUSES   Herniated or slipped disc.  Degenerative disk disease.  A pain disorder involving the narrow muscle in the buttocks (piriformis syndrome).  Pelvic injury or fracture.  Pregnancy.  Tumor (rare). SYMPTOMS  Symptoms can vary from mild to very severe. The symptoms usually travel from the low back to the buttocks and down the back of the leg. Symptoms can include:  Mild tingling or dull aches in the lower back, leg, or hip.  Numbness in the back of the calf or sole of the foot.  Burning sensations in the lower back, leg, or hip.  Sharp pains in the lower back, leg, or hip.  Leg weakness.  Severe back pain inhibiting movement. These symptoms may get worse with coughing, sneezing, laughing, or prolonged sitting or standing. Also, being overweight may worsen symptoms. DIAGNOSIS  Your caregiver will perform a physical  exam to look for common symptoms of sciatica. He or she may ask you to do certain movements or activities that would trigger sciatic nerve pain. Other tests may be performed to find the cause of the sciatica. These may include:  Blood tests.  X-rays.  Imaging tests, such as an MRI or CT scan. TREATMENT  Treatment is directed at the cause of the sciatic pain. Sometimes, treatment is not necessary and the pain and discomfort goes away on its own. If treatment is needed, your caregiver may suggest:  Over-the-counter medicines to relieve pain.  Prescription medicines, such as anti-inflammatory medicine, muscle relaxants, or narcotics.  Applying heat or ice to the painful area.  Steroid injections to lessen pain, irritation, and inflammation around the nerve.  Reducing activity during periods of pain.  Exercising and stretching to strengthen your abdomen and improve flexibility of your spine. Your caregiver may suggest losing weight if the extra weight makes the back pain worse.  Physical therapy.  Surgery to eliminate what is pressing or pinching the nerve, such as a bone spur or part of a herniated disk. HOME CARE INSTRUCTIONS   Only take over-the-counter or prescription medicines for pain or discomfort as directed by your caregiver.  Apply ice to the affected area for 20 minutes, 3-4 times a day for the first 48-72 hours. Then try heat in the same way.  Exercise, stretch, or perform your usual activities if these do not aggravate your pain.  Attend physical therapy sessions  as directed by your caregiver.  Keep all follow-up appointments as directed by your caregiver.  Do not wear high heels or shoes that do not provide proper support.  Check your mattress to see if it is too soft. A firm mattress may lessen your pain and discomfort. SEEK IMMEDIATE MEDICAL CARE IF:   You lose control of your bowel or bladder (incontinence).  You have increasing weakness in the lower back,  pelvis, buttocks, or legs.  You have redness or swelling of your back.  You have a burning sensation when you urinate.  You have pain that gets worse when you lie down or awakens you at night.  Your pain is worse than you have experienced in the past.  Your pain is lasting longer than 4 weeks.  You are suddenly losing weight without reason. MAKE SURE YOU:  Understand these instructions.  Will watch your condition.  Will get help right away if you are not doing well or get worse.   This information is not intended to replace advice given to you by your health care provider. Make sure you discuss any questions you have with your health care provider.   Document Released: 07/16/2001 Document Revised: 04/12/2015 Document Reviewed: 12/01/2011 Elsevier Interactive Patient Education [employer]

## 2015-10-18 ENCOUNTER — Emergency Department (HOSPITAL_COMMUNITY)
Admission: EM | Admit: 2015-10-18 | Discharge: 2015-10-18 | Disposition: A | Discharge status: Home / Self Care | Payer: No Typology Code available for payment source | Attending: Emergency Medicine | Admitting: Emergency Medicine

## 2015-10-18 ENCOUNTER — Emergency Department (HOSPITAL_COMMUNITY): Payer: No Typology Code available for payment source

## 2015-10-18 ENCOUNTER — Encounter (HOSPITAL_COMMUNITY): Payer: Self-pay | Admitting: *Deleted

## 2015-10-18 DIAGNOSIS — Z8719 Personal history of other diseases of the digestive system: Secondary | ICD-10-CM | POA: Insufficient documentation

## 2015-10-18 DIAGNOSIS — Y9389 Activity, other specified: Secondary | ICD-10-CM | POA: Insufficient documentation

## 2015-10-18 DIAGNOSIS — Z880 Allergy status to penicillin: Secondary | ICD-10-CM | POA: Diagnosis not present

## 2015-10-18 DIAGNOSIS — Y92410 Unspecified street and highway as the place of occurrence of the external cause: Secondary | ICD-10-CM | POA: Insufficient documentation

## 2015-10-18 DIAGNOSIS — Y998 Other external cause status: Secondary | ICD-10-CM | POA: Diagnosis not present

## 2015-10-18 DIAGNOSIS — S3992XA Unspecified injury of lower back, initial encounter: Secondary | ICD-10-CM | POA: Diagnosis not present

## 2015-10-18 DIAGNOSIS — Z8619 Personal history of other infectious and parasitic diseases: Secondary | ICD-10-CM | POA: Diagnosis not present

## 2015-10-18 DIAGNOSIS — S29001A Unspecified injury of muscle and tendon of front wall of thorax, initial encounter: Secondary | ICD-10-CM | POA: Diagnosis not present

## 2015-10-18 DIAGNOSIS — Z87891 Personal history of nicotine dependence: Secondary | ICD-10-CM | POA: Diagnosis not present

## 2015-10-18 DIAGNOSIS — S0990XA Unspecified injury of head, initial encounter: Secondary | ICD-10-CM | POA: Diagnosis not present

## 2015-10-18 DIAGNOSIS — Z872 Personal history of diseases of the skin and subcutaneous tissue: Secondary | ICD-10-CM | POA: Insufficient documentation

## 2015-10-18 DIAGNOSIS — J45909 Unspecified asthma, uncomplicated: Secondary | ICD-10-CM | POA: Diagnosis not present

## 2015-10-18 MED ORDER — KETOROLAC TROMETHAMINE 60 MG/2ML IM SOLN
60.0000 mg | Freq: Once | INTRAMUSCULAR | Status: AC
  Administered 2015-10-18: 60 mg via INTRAMUSCULAR
  Filled 2015-10-18: qty 2

## 2015-10-18 MED ORDER — CYCLOBENZAPRINE HCL 10 MG PO TABS: 10.0000 mg | ORAL_TABLET | Freq: Two times a day (BID) | ORAL | Status: DC | PRN

## 2015-10-18 MED ORDER — IBUPROFEN 800 MG PO TABS: 800.0000 mg | ORAL_TABLET | Freq: Three times a day (TID) | ORAL | Status: DC

## 2015-10-18 MED ORDER — HYDROCODONE-ACETAMINOPHEN 5-325 MG PO TABS: 2.0000 | ORAL_TABLET | ORAL | Status: DC | PRN

## 2015-10-18 MED ORDER — CYCLOBENZAPRINE HCL 10 MG PO TABS
10.0000 mg | ORAL_TABLET | Freq: Once | ORAL | Status: AC
  Administered 2015-10-18: 10 mg via ORAL
  Filled 2015-10-18: qty 1
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
## 2015-10-18 NOTE — Discharge Instructions (Signed)
Take your medications as prescribed. I recommended eating prior to taking ibuprofen to prevent GI side effects. He may apply ice to affected area for 15-20 minutes 3-4 times daily for the next 2 days, he may then use heat as needed for pain relief. I recommend refraining from doing any heavy lifting, exercising or repetitive movements that exacerbate your symptoms for the next few days. Follow-up with your primary care provider if your symptoms have not improved over the next 4-5 days. Please return to the Emergency Department if symptoms worsen or new onset of fever, numbness, tingling, groin anesthesia, abdominal pain, urinary retention, loss of bowel or bladder, weakness.

## 2015-10-18 NOTE — ED Notes (Signed)
Called pt again with no answer in lobby
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
## 2015-10-18 NOTE — ED Notes (Signed)
Declined W/C at D/C and was escorted to lobby by RN. 

## 2015-11-16 ENCOUNTER — Encounter (HOSPITAL_COMMUNITY): Payer: Self-pay | Admitting: *Deleted

## 2015-11-16 ENCOUNTER — Emergency Department (HOSPITAL_COMMUNITY)
Admission: EM | Admit: 2015-11-16 | Discharge: 2015-11-16 | Disposition: A | Discharge status: Home / Self Care | Payer: BLUE CROSS/BLUE SHIELD | Attending: Emergency Medicine | Admitting: Emergency Medicine

## 2015-11-16 DIAGNOSIS — Z8719 Personal history of other diseases of the digestive system: Secondary | ICD-10-CM | POA: Diagnosis not present

## 2015-11-16 DIAGNOSIS — L03818 Cellulitis of other sites: Secondary | ICD-10-CM | POA: Insufficient documentation

## 2015-11-16 DIAGNOSIS — Z872 Personal history of diseases of the skin and subcutaneous tissue: Secondary | ICD-10-CM | POA: Diagnosis not present

## 2015-11-16 DIAGNOSIS — Y92410 Unspecified street and highway as the place of occurrence of the external cause: Secondary | ICD-10-CM | POA: Insufficient documentation

## 2015-11-16 DIAGNOSIS — Z880 Allergy status to penicillin: Secondary | ICD-10-CM | POA: Insufficient documentation

## 2015-11-16 DIAGNOSIS — Z9089 Acquired absence of other organs: Secondary | ICD-10-CM | POA: Diagnosis not present

## 2015-11-16 DIAGNOSIS — R21 Rash and other nonspecific skin eruption: Secondary | ICD-10-CM | POA: Insufficient documentation

## 2015-11-16 DIAGNOSIS — Y9389 Activity, other specified: Secondary | ICD-10-CM | POA: Insufficient documentation

## 2015-11-16 DIAGNOSIS — S3991XA Unspecified injury of abdomen, initial encounter: Secondary | ICD-10-CM | POA: Diagnosis present

## 2015-11-16 DIAGNOSIS — J45909 Unspecified asthma, uncomplicated: Secondary | ICD-10-CM | POA: Diagnosis not present

## 2015-11-16 DIAGNOSIS — Z8659 Personal history of other mental and behavioral disorders: Secondary | ICD-10-CM | POA: Insufficient documentation

## 2015-11-16 DIAGNOSIS — R590 Localized enlarged lymph nodes: Secondary | ICD-10-CM | POA: Insufficient documentation

## 2015-11-16 DIAGNOSIS — R1030 Lower abdominal pain, unspecified: Secondary | ICD-10-CM

## 2015-11-16 DIAGNOSIS — Y998 Other external cause status: Secondary | ICD-10-CM | POA: Insufficient documentation

## 2015-11-16 DIAGNOSIS — Z87891 Personal history of nicotine dependence: Secondary | ICD-10-CM | POA: Insufficient documentation

## 2015-11-16 LAB — URINALYSIS, ROUTINE W REFLEX MICROSCOPIC
Bilirubin Urine: NEGATIVE
GLUCOSE, UA: NEGATIVE mg/dL
Hgb urine dipstick: NEGATIVE
Ketones, ur: NEGATIVE mg/dL
Nitrite: NEGATIVE
PROTEIN: NEGATIVE mg/dL
SPECIFIC GRAVITY, URINE: 1.035 — AB (ref 1.005–1.030)
pH: 6.5 (ref 5.0–8.0)

## 2015-11-16 LAB — URINE MICROSCOPIC-ADD ON: RBC / HPF: NONE SEEN RBC/hpf (ref 0–5)

## 2015-11-16 LAB — GC/CHLAMYDIA PROBE AMP (~~LOC~~) NOT AT ARMC
Chlamydia: NEGATIVE
Neisseria Gonorrhea: NEGATIVE

## 2015-11-16 MED ORDER — CEFTRIAXONE SODIUM 250 MG IJ SOLR
250.0000 mg | Freq: Once | INTRAMUSCULAR | Status: AC
  Administered 2015-11-16: 250 mg via INTRAMUSCULAR
  Filled 2015-11-16: qty 250

## 2015-11-16 MED ORDER — SULFAMETHOXAZOLE-TRIMETHOPRIM 800-160 MG PO TABS: 1.0000 | ORAL_TABLET | Freq: Two times a day (BID) | ORAL | Status: AC

## 2015-11-16 MED ORDER — AZITHROMYCIN 250 MG PO TABS
1000.0000 mg | ORAL_TABLET | Freq: Once | ORAL | Status: AC
  Administered 2015-11-16: 1000 mg via ORAL
  Filled 2015-11-16: qty 4

## 2015-11-16 NOTE — ED Notes (Addendum)
Pt c/o groin pain x 1 week. C/o painful urination. Denies seeing any blood in his urine or any penile discharge. Pt states that he has had unprotected sex recently.
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
## 2015-11-16 NOTE — ED Notes (Addendum)
Pt states that it feels like "blue balls" and like "premature ejaculation" but there is nothing there. Feels like it is a drip but nothing is dripping.

## 2015-11-16 NOTE — Discharge Instructions (Signed)
Take the meds prescribed for the groin rash, or swollen spot you were talking about.   Cellulitis Cellulitis is an infection of the skin and the tissue beneath it. The infected area is usually red and tender. Cellulitis occurs most often in the arms and lower legs.  CAUSES  Cellulitis is caused by bacteria that enter the skin through cracks or cuts in the skin. The most common types of bacteria that cause cellulitis are staphylococci and streptococci. SIGNS AND SYMPTOMS   Redness and warmth.  Swelling.  Tenderness or pain.  Fever. DIAGNOSIS  Your health care provider can usually determine what is wrong based on a physical exam. Blood tests may also be done. TREATMENT  Treatment usually involves taking an antibiotic medicine. HOME CARE INSTRUCTIONS   Take your antibiotic medicine as directed by your health care provider. Finish the antibiotic even if you start to feel better.  Keep the infected arm or leg elevated to reduce swelling.  Apply a warm cloth to the affected area up to 4 times per day to relieve pain.  Take medicines only as directed by your health care provider.  Keep all follow-up visits as directed by your health care provider. SEEK MEDICAL CARE IF:   You notice red streaks coming from the infected area.  Your red area gets larger or turns dark in color.  Your bone or joint underneath the infected area becomes painful after the skin has healed.  Your infection returns in the same area or another area.  You notice a swollen bump in the infected area.  You develop new symptoms.  You have a fever. SEEK IMMEDIATE MEDICAL CARE IF:   You feel very sleepy.  You develop vomiting or diarrhea.  You have a general ill feeling (malaise) with muscle aches and pains.   This information is not intended to replace advice given to you by your health care provider. Make sure you discuss any questions you have with your health care provider.   Document Released:  05/01/2005 Document Revised: 04/12/2015 Document Reviewed: 10/07/2011 Elsevier Interactive Patient Education [employer]2016 [employer] Inc[employer]

## 2015-11-17 LAB — URINE CULTURE: CULTURE: NO GROWTH

## 2016-01-30 ENCOUNTER — Emergency Department (HOSPITAL_COMMUNITY)
Admission: EM | Admit: 2016-01-30 | Discharge: 2016-01-30 | Disposition: A | Discharge status: Home / Self Care | Payer: Medicaid Other | Attending: Emergency Medicine | Admitting: Emergency Medicine

## 2016-01-30 ENCOUNTER — Encounter (HOSPITAL_COMMUNITY): Payer: Self-pay

## 2016-01-30 DIAGNOSIS — M79604 Pain in right leg: Secondary | ICD-10-CM | POA: Diagnosis not present

## 2016-01-30 DIAGNOSIS — R030 Elevated blood-pressure reading, without diagnosis of hypertension: Secondary | ICD-10-CM | POA: Diagnosis not present

## 2016-01-30 DIAGNOSIS — J45909 Unspecified asthma, uncomplicated: Secondary | ICD-10-CM | POA: Diagnosis not present

## 2016-01-30 DIAGNOSIS — H109 Unspecified conjunctivitis: Secondary | ICD-10-CM | POA: Diagnosis not present

## 2016-01-30 DIAGNOSIS — H578 Other specified disorders of eye and adnexa: Secondary | ICD-10-CM | POA: Diagnosis present

## 2016-01-30 DIAGNOSIS — F329 Major depressive disorder, single episode, unspecified: Secondary | ICD-10-CM | POA: Diagnosis not present

## 2016-01-30 DIAGNOSIS — M79605 Pain in left leg: Secondary | ICD-10-CM | POA: Insufficient documentation

## 2016-01-30 DIAGNOSIS — Z87891 Personal history of nicotine dependence: Secondary | ICD-10-CM | POA: Diagnosis not present

## 2016-01-30 DIAGNOSIS — F909 Attention-deficit hyperactivity disorder, unspecified type: Secondary | ICD-10-CM | POA: Diagnosis not present

## 2016-01-30 DIAGNOSIS — J029 Acute pharyngitis, unspecified: Secondary | ICD-10-CM | POA: Diagnosis not present

## 2016-01-30 LAB — D-DIMER, QUANTITATIVE (NOT AT ARMC)

## 2016-01-30 LAB — RAPID STREP SCREEN (MED CTR MEBANE ONLY): Streptococcus, Group A Screen (Direct): NEGATIVE

## 2016-01-30 MED ORDER — TOBRAMYCIN 0.3 % OP SOLN: 1.0000 [drp] | OPHTHALMIC | Status: DC

## 2016-01-30 MED ORDER — ACETAMINOPHEN 500 MG PO TABS
1000.0000 mg | ORAL_TABLET | Freq: Once | ORAL | Status: AC
  Administered 2016-01-30: 1000 mg via ORAL
  Filled 2016-01-30: qty 2

## 2016-01-30 MED ORDER — IBUPROFEN 200 MG PO TABS: 400.0000 mg | ORAL_TABLET | Freq: Once | ORAL | Status: DC

## 2016-01-30 MED FILL — TOBRAMYCIN 0.3% EYE DROPS: 0.3 | 20 days supply | Qty: 5 | Fill #0

## 2016-01-30 NOTE — ED Provider Notes (Signed)
CSN: [id number]     Arrival date & time 01/30/16  0744 History   First MD Initiated Contact with Patient 01/30/16 0805     Chief Complaint  Patient presents with  . Sore Throat  . Eye Drainage     (Consider location/radiation/quality/duration/timing/severity/associated sxs/prior Treatment) HPI Complains of bilateral thigh pain, posterior onset 12 midnight tonight pain worse with walking and not improved by anything pain is constant. Pain developed while waiting at waiting room at labor suite while his girlfriend is in labor. He also complains of sore throat for 2 days and bilateral eye drainage and blurred vision for 2 days accompanied by red eyes. He reports that his eyelashes were matted together yesterday Sore throat Treated with Mucinex without relief. Denies fever. Denies other associated symptoms.  Past Medical History  Diagnosis Date  . Asthma   . Seasonal allergies   . Eczema   . GERD (gastroesophageal reflux disease)   . ADHD (attention deficit hyperactivity disorder)   . Depression    Past Surgical History  Procedure Laterality Date  . Tonsillectomy    . Adenoidectomy    . Laparoscopic appendectomy N/A 08/19/2013    Procedure: APPENDECTOMY LAPAROSCOPIC;  Surgeon: Shelly Rubensteinouglas A Blackman, MD;  Location: [facility] OR;  Service: General;  Laterality: N/A;  . Appendectomy     Family History  Problem Relation Age of Onset  . Cancer Paternal Grandmother     ovarian   Social History  Substance Use Topics  . Smoking status: Former Smoker -- 0.25 packs/day    Types: Cigarettes    Quit date: 04/10/2015  . Smokeless tobacco: Never Used  . Alcohol Use: Yes  Current smoker denies alcohol denies drug use  Review of Systems  HENT: Positive for sore throat.   Eyes: Positive for discharge, redness and visual disturbance.  Musculoskeletal: Positive for myalgias.       Bilateral thigh pain  Skin: Positive for rash.       Rash for 3 months on arms  All other systems reviewed and are  negative.     Allergies  Minocin; Shellfish allergy; Amoxicillin-pot clavulanate; and Penicillins  Home Medications   Prior to Admission medications   Medication Sig Start Date End Date Taking? Authorizing Provider  cyclobenzaprine (FLEXERIL) 10 MG tablet Take 1 tablet (10 mg total) by mouth 2 (two) times daily as needed for muscle spasms. 10/18/15   Barrett HenleNicole Elizabeth Nadeau, PA-C  HYDROcodone-acetaminophen (NORCO/VICODIN) 5-325 MG tablet Take 2 tablets by mouth every 4 (four) hours as needed. 10/18/15   Barrett HenleNicole Elizabeth Nadeau, PA-C  ibuprofen (ADVIL,MOTRIN) 800 MG tablet Take 1 tablet (800 mg total) by mouth 3 (three) times daily. Patient taking differently: Take 800 mg by mouth 3 (three) times daily as needed for mild pain.  10/18/15   Satira SarkNicole Elizabeth Nadeau, PA-C   BP 141/106 mmHg  Pulse 89  Temp(Src) 98.5 F (36.9 C) (Oral)  Resp 18  Ht 6' (1.829 m)  Wt 240 lb (108.863 kg)  BMI 32.54 kg/m2  SpO2 100% Physical Exam  Constitutional: He is oriented to person, place, and time. He appears well-developed and well-nourished. No distress.  HENT:  Head: Normocephalic and atraumatic.  Right Ear: External ear normal.  Left Ear: External ear normal.  Oral pharynx minimally reddened uvula midline no tonsillar exudate or bilateral tympanic membranes normal  Eyes: Pupils are equal, round, and reactive to light. Right eye exhibits discharge. Left eye exhibits discharge.  Yellowish crust on eyelashes. Bilateral sub-conjunctival erythema. Visual acuity left  eye 20/25. Right eye 20/25  Neck: Neck supple. No tracheal deviation present. No thyromegaly present.  Cardiovascular: Normal rate and regular rhythm.   No murmur heard. Pulmonary/Chest: Effort normal and breath sounds normal.  Abdominal: Soft. Bowel sounds are normal. He exhibits no distension. There is no tenderness.  Musculoskeletal: Normal range of motion. He exhibits no edema or tenderness.  All 4 extremities without redness swelling  or tenderness, neurovascularly intact. DP pulses 2+ bilaterally.  Neurological: He is alert and oriented to person, place, and time. No cranial nerve deficit. Coordination normal.  Gait normal  Skin: Skin is warm and dry. Rash noted.  Chronic appearing macular rash on bilateral arms and hands not involving palms  Psychiatric: He has a normal mood and affect.  Nursing note and vitals reviewed.   ED Course  Procedures (including critical care time) Labs Review Labs Reviewed - No data to display  Imaging Review No results found. I have personally reviewed and evaluated these images and lab results as part of my medical decision-making.   EKG Interpretation None     I counseled patient 5 minutes on smoking cessation Results for orders placed or performed during the hospital encounter of 01/30/16  Rapid strep screen (not at [facility])  Result Value Ref Range   Streptococcus, Group A Screen (Direct) NEGATIVE NEGATIVE  D-dimer, quantitative (not at [facility])  Result Value Ref Range   D-Dimer, Quant <0.27 0.00 - 0.50 ug/mL-FEU   No results found.  MDM  Pretest clinical probability for DVT low Final diagnoses:  None  Plan prescription tobramycin ophthalmic drops. Tylenol for pain. Follow-up with PMD. Blood pressure recheck 3 weeks. I asked him to follow-up with his primary care physician for referral to dermatologist regarding chronic rash Diagnosis #1 conjunctivitis #2 pharyngitis #3 bilateral leg pain #4 chronic rash #5 elevated blood pressure #6 tobacco abuse      Doug SouSam Jaquis Picklesimer, MD 01/30/16 [phone]

## 2016-01-30 NOTE — ED Notes (Signed)
Pt ambulated to room without difficulty

## 2016-01-30 NOTE — Discharge Instructions (Signed)
Bacterial Conjunctivitis Take Tylenol as directed for pain. Ask your primary care physician for referral to a dermatologist regarding your rash. See your primary care physician within the next 3 weeks. Ask him to recheck your blood pressure. Today's was elevated at 148/111. Ask your primary care physician to help you to stop smoking. Bacterial conjunctivitis (commonly called pink eye) is redness, soreness, or puffiness (inflammation) of the white part of your eye. It is caused by a germ called bacteria. These germs can easily spread from person to person (contagious). Your eye often will become red or pink. Your eye may also become irritated, watery, or have a thick discharge.  HOME CARE   Apply a cool, clean washcloth over closed eyelids. Do this for 10-20 minutes, 3-4 times a day while you have pain.  Gently wipe away any fluid coming from the eye with a warm, wet washcloth or cotton ball.  Wash your hands often with soap and water. Use paper towels to dry your hands.  Do not share towels or washcloths.  Change or wash your pillowcase every day.  Do not use eye makeup until the infection is gone.  Do not use machines or drive if your vision is blurry.  Stop using contact lenses. Do not use them again until your doctor says it is okay.  Do not touch the tip of the eye drop bottle or medicine tube with your fingers when you put medicine on the eye. GET HELP RIGHT AWAY IF:   Your eye is not better after 3 days of starting your medicine.  You have a yellowish fluid coming out of the eye.  You have more pain in the eye.  Your eye redness is spreading.  Your vision becomes blurry.  You have a fever or lasting symptoms for more than 2-3 days.  You have a fever and your symptoms suddenly get worse.  You have pain in the face.  Your face gets red or puffy (swollen). MAKE SURE YOU:   Understand these instructions.  Will watch this condition.  Will get help right away if you are  not doing well or get worse.   This information is not intended to replace advice given to you by your health care provider. Make sure you discuss any questions you have with your health care provider.   Document Released: 04/30/2008 Document Revised: 07/08/2012 Document Reviewed: 03/27/2012 Elsevier Interactive Patient Education [employer]2016 [employer]vier Inc.

## 2016-01-30 NOTE — ED Notes (Addendum)
Patient here with sore throat, cold symtpoms and bilateral eye redness and drainage x 3 days. Also complains of bilateral lower leg aching, denies trauma. Right worse than left. NAD

## 2016-02-01 LAB — CULTURE, GROUP A STREP (THRC)

## 2016-02-27 ENCOUNTER — Encounter (HOSPITAL_COMMUNITY): Payer: Self-pay | Admitting: Emergency Medicine

## 2016-02-27 ENCOUNTER — Emergency Department (HOSPITAL_COMMUNITY)
Admission: EM | Admit: 2016-02-27 | Discharge: 2016-02-27 | Disposition: A | Discharge status: Home / Self Care | Payer: Medicaid Other | Attending: Emergency Medicine | Admitting: Emergency Medicine

## 2016-02-27 DIAGNOSIS — R21 Rash and other nonspecific skin eruption: Secondary | ICD-10-CM

## 2016-02-27 DIAGNOSIS — J45909 Unspecified asthma, uncomplicated: Secondary | ICD-10-CM | POA: Insufficient documentation

## 2016-02-27 DIAGNOSIS — B86 Scabies: Secondary | ICD-10-CM | POA: Insufficient documentation

## 2016-02-27 DIAGNOSIS — Z87891 Personal history of nicotine dependence: Secondary | ICD-10-CM | POA: Diagnosis not present

## 2016-02-27 MED ORDER — SULFAMETHOXAZOLE-TRIMETHOPRIM 800-160 MG PO TABS: 1.0000 | ORAL_TABLET | Freq: Two times a day (BID) | ORAL | 0 refills | Status: AC

## 2016-02-27 MED ORDER — PERMETHRIN 5 % EX CREA: TOPICAL_CREAM | CUTANEOUS | 1 refills | Status: DC

## 2016-02-27 NOTE — ED Provider Notes (Signed)
[facility]-EMERGENCY DEPT Provider Note   CSN: [id number] Arrival date & time: 02/27/16  1356  First Provider Contact:  None    By signing my name below, I, Phillip Leach, attest that this documentation has been prepared under the direction and in the presence of Roxy Horseman, PA-C. Electronically Signed: Doreatha Leach, ED Scribe. 02/27/16. 2:21 PM.   History   Chief Complaint Chief Complaint  Patient presents with  . Rash    [name] is a 20 y.o. male with h/o eczema who presents to the Emergency Department complaining of a moderate, gradually worsening, pruritic rash to his extremities and face onset last week. He also complains of a similar area of redness and swelling on his buttock that began last week, which he attributes to a bug bite. Pt states the rash is worsened when he scratches the areas. No new soaps, lotions or detergents. He denies fever.   The history is provided by the patient. No language interpreter was used.      Past Medical History:  Diagnosis Date  . ADHD (attention deficit hyperactivity disorder)   . Asthma   . Depression   . Eczema   . GERD (gastroesophageal reflux disease)   . Seasonal allergies     Patient Active Problem List   Diagnosis Date Noted  . Tobacco use disorder 09/13/2014  . Appendicitis, acute 09/14/2013  . Epiploic appendagitis 08/19/2013    Past Surgical History:  Procedure Laterality Date  . ADENOIDECTOMY    . APPENDECTOMY    . LAPAROSCOPIC APPENDECTOMY N/A 08/19/2013   Procedure: APPENDECTOMY LAPAROSCOPIC;  Surgeon: Shelly Rubenstein, MD;  Location: [facility] OR;  Service: General;  Laterality: N/A;  . TONSILLECTOMY         Home Medications    Prior to Admission medications   Medication Sig Start Date End Date Taking? Authorizing Provider  guaiFENesin (MUCINEX) 600 MG 12 hr tablet Take 1,200 mg by mouth 2 (two) times daily as needed for cough.    Historical Provider, MD  tobramycin (TOBREX) 0.3 % ophthalmic solution  Place 1 drop into both eyes every 4 (four) hours. 01/30/16   Doug Sou, MD    Family History Family History  Problem Relation Age of Onset  . Cancer Paternal Grandmother     ovarian    Social History Social History  Substance Use Topics  . Smoking status: Former Smoker    Packs/day: 0.25    Types: Cigarettes    Quit date: 04/10/2015  . Smokeless tobacco: Never Used  . Alcohol use Yes     Allergies   Minocin [minocycline hcl]; Shellfish allergy; Amoxicillin-pot clavulanate; and Penicillins   Review of Systems Review of Systems  Constitutional: Negative for fever.  Skin: Positive for rash.       +area of redness and swelling on the buttock     Physical Exam Updated Vital Signs BP 121/76 (BP Location: Right Arm)   Pulse 79   Temp 98 F (36.7 C) (Oral)   Resp 18   SpO2 100%   Physical Exam  Constitutional: He appears well-developed and well-nourished.  HENT:  Head: Normocephalic.  Eyes: Conjunctivae are normal.  Cardiovascular: Normal rate.   Pulmonary/Chest: Effort normal. No respiratory distress.  Abdominal: He exhibits no distension.  Musculoskeletal: Normal range of motion.  Neurological: He is alert.  Skin: Skin is warm and dry. Rash noted.  Scattered bites on upper extremities, including the web spaces of the hands/fingers. Also involving the buttock and lower extremities.  Concerning for scabies with possible superimposed infection.   Psychiatric: He has a normal mood and affect. His behavior is normal.  Nursing note and vitals reviewed.   ED Treatments / Results   Procedures Procedures (including critical care time)  DIAGNOSTIC STUDIES: Oxygen Saturation is 100% on RA, normal by my interpretation.    COORDINATION OF CARE: 2:15 PM Discussed treatment plan with pt at bedside which includes topical ointment, doxycycline, dermatology f/u and pt agreed to plan.    Medications Ordered in ED Medications - No data to display   Initial Impression  / Assessment and Plan / ED Course  I have reviewed the triage vital signs and the nursing notes.  Pertinent labs & imaging results that were available during my care of the patient were reviewed by me and considered in my medical decision making (see chart for details).  Clinical Course    Patient with scattered bites on extremities.  Appears characteristic of scabies and possible superimposed infection.  Will treat with permethrin and bactrim.  PCP/dermatology follow-up.  Final Clinical Impressions(s) / ED Diagnoses   Final diagnoses:  Rash  Scabies    New Prescriptions New Prescriptions   PERMETHRIN (ELIMITE) 5 % CREAM    Apply to entire body other than face - let sit for 14 hours then wash off, may repeat in 1 week if still having symptoms   SULFAMETHOXAZOLE-TRIMETHOPRIM (BACTRIM DS,SEPTRA DS) 800-160 MG TABLET    Take 1 tablet by mouth 2 (two) times daily.    I personally performed the services described in this documentation, which was scribed in my presence. The recorded information has been reviewed and is accurate.      Roxy Horseman, PA-C 02/27/16 1425    Raeford Razor, MD 03/02/16 [phone]

## 2016-02-28 MED FILL — PERMETHRIN 5% CREAM: 5 | 14 days supply | Qty: 60 | Fill #0

## 2017-09-11 ENCOUNTER — Other Ambulatory Visit: Payer: Self-pay

## 2017-09-11 ENCOUNTER — Encounter (HOSPITAL_BASED_OUTPATIENT_CLINIC_OR_DEPARTMENT_OTHER): Payer: Self-pay | Admitting: *Deleted

## 2017-09-11 ENCOUNTER — Emergency Department (HOSPITAL_BASED_OUTPATIENT_CLINIC_OR_DEPARTMENT_OTHER): Payer: No Typology Code available for payment source

## 2017-09-11 ENCOUNTER — Emergency Department (HOSPITAL_BASED_OUTPATIENT_CLINIC_OR_DEPARTMENT_OTHER)
Admission: EM | Admit: 2017-09-11 | Discharge: 2017-09-11 | Disposition: A | Discharge status: Home / Self Care | Payer: No Typology Code available for payment source | Attending: Emergency Medicine | Admitting: Emergency Medicine

## 2017-09-11 DIAGNOSIS — Y999 Unspecified external cause status: Secondary | ICD-10-CM | POA: Diagnosis not present

## 2017-09-11 DIAGNOSIS — S41112A Laceration without foreign body of left upper arm, initial encounter: Secondary | ICD-10-CM | POA: Insufficient documentation

## 2017-09-11 DIAGNOSIS — Y92410 Unspecified street and highway as the place of occurrence of the external cause: Secondary | ICD-10-CM | POA: Diagnosis not present

## 2017-09-11 DIAGNOSIS — Y9389 Activity, other specified: Secondary | ICD-10-CM | POA: Insufficient documentation

## 2017-09-11 DIAGNOSIS — R51 Headache: Secondary | ICD-10-CM | POA: Insufficient documentation

## 2017-09-11 DIAGNOSIS — T148XXA Other injury of unspecified body region, initial encounter: Secondary | ICD-10-CM

## 2017-09-11 DIAGNOSIS — M25562 Pain in left knee: Secondary | ICD-10-CM

## 2017-09-11 DIAGNOSIS — J45909 Unspecified asthma, uncomplicated: Secondary | ICD-10-CM | POA: Diagnosis not present

## 2017-09-11 DIAGNOSIS — Z87891 Personal history of nicotine dependence: Secondary | ICD-10-CM | POA: Insufficient documentation

## 2017-09-11 DIAGNOSIS — R0781 Pleurodynia: Secondary | ICD-10-CM

## 2017-09-11 DIAGNOSIS — R0789 Other chest pain: Secondary | ICD-10-CM | POA: Insufficient documentation

## 2017-09-11 DIAGNOSIS — S8992XA Unspecified injury of left lower leg, initial encounter: Secondary | ICD-10-CM | POA: Diagnosis present

## 2017-09-11 DIAGNOSIS — R519 Headache, unspecified: Secondary | ICD-10-CM

## 2017-09-11 MED ORDER — CYCLOBENZAPRINE HCL 10 MG PO TABS: 10.0000 mg | ORAL_TABLET | Freq: Two times a day (BID) | ORAL | 0 refills | Status: DC | PRN

## 2017-09-11 MED ORDER — ACETAMINOPHEN 500 MG PO TABS
1000.0000 mg | ORAL_TABLET | Freq: Once | ORAL | Status: AC
  Administered 2017-09-11: 1000 mg via ORAL
  Filled 2017-09-11: qty 2

## 2017-09-11 NOTE — ED Notes (Signed)
Pt. Has been transported to radiology via stretcher
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
## 2017-09-11 NOTE — ED Notes (Signed)
ED Provider at bedside. 
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
## 2017-09-11 NOTE — Discharge Instructions (Signed)

## 2017-09-11 NOTE — ED Notes (Signed)
Pt refused crutches.

## 2019-03-02 ENCOUNTER — Other Ambulatory Visit: Payer: Self-pay

## 2019-03-02 ENCOUNTER — Ambulatory Visit (HOSPITAL_BASED_OUTPATIENT_CLINIC_OR_DEPARTMENT_OTHER): Payer: Self-pay

## 2019-03-02 ENCOUNTER — Encounter (HOSPITAL_BASED_OUTPATIENT_CLINIC_OR_DEPARTMENT_OTHER): Payer: Self-pay | Admitting: Emergency Medicine

## 2019-03-02 ENCOUNTER — Emergency Department (HOSPITAL_BASED_OUTPATIENT_CLINIC_OR_DEPARTMENT_OTHER)
Admission: EM | Admit: 2019-03-02 | Discharge: 2019-03-02 | Disposition: A | Discharge status: Home / Self Care | Payer: Federal, State, Local not specified - PPO | Attending: Emergency Medicine | Admitting: Emergency Medicine

## 2019-03-02 ENCOUNTER — Inpatient Hospital Stay (HOSPITAL_BASED_OUTPATIENT_CLINIC_OR_DEPARTMENT_OTHER): Admit: 2019-03-02 | Payer: Self-pay

## 2019-03-02 ENCOUNTER — Ambulatory Visit (HOSPITAL_BASED_OUTPATIENT_CLINIC_OR_DEPARTMENT_OTHER)
Admission: RE | Admit: 2019-03-02 | Discharge: 2019-03-02 | Disposition: A | Discharge status: Home / Self Care | Payer: Federal, State, Local not specified - PPO | Source: Ambulatory Visit | Attending: Emergency Medicine | Admitting: Emergency Medicine

## 2019-03-02 ENCOUNTER — Ambulatory Visit (HOSPITAL_BASED_OUTPATIENT_CLINIC_OR_DEPARTMENT_OTHER): Admit: 2019-03-02 | Payer: Self-pay

## 2019-03-02 ENCOUNTER — Telehealth (HOSPITAL_COMMUNITY): Payer: Self-pay

## 2019-03-02 DIAGNOSIS — N50819 Testicular pain, unspecified: Secondary | ICD-10-CM

## 2019-03-02 DIAGNOSIS — F909 Attention-deficit hyperactivity disorder, unspecified type: Secondary | ICD-10-CM | POA: Insufficient documentation

## 2019-03-02 DIAGNOSIS — Z87891 Personal history of nicotine dependence: Secondary | ICD-10-CM | POA: Diagnosis not present

## 2019-03-02 DIAGNOSIS — N50812 Left testicular pain: Secondary | ICD-10-CM | POA: Diagnosis present

## 2019-03-02 DIAGNOSIS — J45909 Unspecified asthma, uncomplicated: Secondary | ICD-10-CM | POA: Diagnosis not present

## 2019-03-02 LAB — URINALYSIS, ROUTINE W REFLEX MICROSCOPIC
Bilirubin Urine: NEGATIVE
Glucose, UA: NEGATIVE mg/dL
Hgb urine dipstick: NEGATIVE
Ketones, ur: NEGATIVE mg/dL
Leukocytes,Ua: NEGATIVE
Nitrite: NEGATIVE
Protein, ur: NEGATIVE mg/dL
Specific Gravity, Urine: 1.025 (ref 1.005–1.030)
pH: 6.5 (ref 5.0–8.0)

## 2019-03-02 MED ORDER — LEVOFLOXACIN 750 MG PO TABS: 750.0000 mg | ORAL_TABLET | Freq: Every day | ORAL | 0 refills | Status: DC

## 2019-03-02 MED ORDER — CEFTRIAXONE SODIUM 250 MG IJ SOLR
250.0000 mg | Freq: Once | INTRAMUSCULAR | Status: AC
  Administered 2019-03-02: 250 mg via INTRAMUSCULAR
  Filled 2019-03-02: qty 250

## 2019-03-02 MED ORDER — AZITHROMYCIN 250 MG PO TABS
1000.0000 mg | ORAL_TABLET | Freq: Once | ORAL | Status: AC
  Administered 2019-03-02: 1000 mg via ORAL
  Filled 2019-03-02: qty 4

## 2019-03-02 MED FILL — levoFLOXacin 750 MG TABS: 750 | 7 days supply | Qty: 7 | Fill #0

## 2019-03-02 NOTE — ED Provider Notes (Signed)
[facility] EMERGENCY DEPARTMENT Provider Note   CSN: [id number] Arrival date & time: 03/02/19  0256    History   Chief Complaint Chief Complaint  Patient presents with  . Testicle Pain    [name] is a 23 y.o. male.     Patient presents to the emergency department for evaluation of testicle pain.  Patient has been experiencing left testicle pain for 2 weeks.  He reports that the pain is waxing and waning.  He had increased pain yesterday but currently the pain is better than it has been.  He says he has been trying to "tough it out".  He has been going and saw the possibility of infection as well as torsion, came to get checked out tonight.  He thinks there was a very slight discharge yesterday, has not noticed any clear obvious discharge.  He has not seen any external lesions.  He denies any injury.     Past Medical History:  Diagnosis Date  . ADHD (attention deficit hyperactivity disorder)   . Asthma   . Depression   . Eczema   . GERD (gastroesophageal reflux disease)   . Seasonal allergies     Patient Active Problem List   Diagnosis Date Noted  . Tobacco use disorder 09/13/2014  . Appendicitis, acute 09/14/2013  . Epiploic appendagitis 08/19/2013    Past Surgical History:  Procedure Laterality Date  . ADENOIDECTOMY    . APPENDECTOMY    . LAPAROSCOPIC APPENDECTOMY N/A 08/19/2013   Procedure: APPENDECTOMY LAPAROSCOPIC;  Surgeon: Harl Bowie, MD;  Location: [facility];  Service: General;  Laterality: N/A;  . TONSILLECTOMY          Home Medications    Prior to Admission medications   Medication Sig Start Date End Date Taking? Authorizing Provider  cyclobenzaprine (FLEXERIL) 10 MG tablet Take 1 tablet (10 mg total) by mouth 2 (two) times daily as needed for muscle spasms. 09/11/17   Nils Flack, Mina A, PA-C  guaiFENesin (MUCINEX) 600 MG 12 hr tablet Take 1,200 mg by mouth 2 (two) times daily as needed for cough.    [provider]   levofloxacin (LEVAQUIN) 750 MG tablet Take 1 tablet (750 mg total) by mouth daily. 03/02/19   Orpah Greek, MD  permethrin (ELIMITE) 5 % cream Apply to entire body other than face - let sit for 14 hours then wash off, may repeat in 1 week if still having symptoms 02/27/16   Montine Circle, PA-C  tobramycin (TOBREX) 0.3 % ophthalmic solution Place 1 drop into both eyes every 4 (four) hours. 01/30/16   Orlie Dakin, MD    Family History Family History  Problem Relation Age of Onset  . Cancer Paternal Grandmother        ovarian    Social History Social History   Tobacco Use  . Smoking status: Former Smoker    Packs/day: 0.25    Types: Cigarettes    Quit date: 04/10/2015    Years since quitting: 3.8  . Smokeless tobacco: Never Used  Substance Use Topics  . Alcohol use: Yes  . Drug use: Yes    Types: Marijuana     Allergies   Minocin [minocycline hcl], Shellfish allergy, Amoxicillin-pot clavulanate, and Penicillins   Review of Systems Review of Systems  Genitourinary: Positive for testicular pain.  All other systems reviewed and are negative.    Physical Exam Updated Vital Signs BP (!) 142/95 (BP Location: Right Arm)   Pulse 67  Temp 98.1 F (36.7 C) (Oral)   Resp 14   Ht 5\' 11"  (1.803 m)   Wt 110 kg   SpO2 100%   BMI 33.82 kg/m   Physical Exam Vitals signs and nursing note reviewed.  Constitutional:      General: He is not in acute distress.    Appearance: Normal appearance. He is well-developed.  HENT:     Head: Normocephalic and atraumatic.     Right Ear: Hearing normal.     Left Ear: Hearing normal.     Nose: Nose normal.  Eyes:     Conjunctiva/sclera: Conjunctivae normal.     Pupils: Pupils are equal, round, and reactive to light.  Neck:     Musculoskeletal: Normal range of motion and neck supple.  Cardiovascular:     Rate and Rhythm: Regular rhythm.     Heart sounds: S1 normal and S2 normal. No murmur. No friction rub. No gallop.    Pulmonary:     Effort: Pulmonary effort is normal. No respiratory distress.     Breath sounds: Normal breath sounds.  Chest:     Chest wall: No tenderness.  Abdominal:     General: Bowel sounds are normal.     Palpations: Abdomen is soft.     Tenderness: There is no abdominal tenderness. There is no guarding or rebound. Negative signs include Murphy's sign and McBurney's sign.     Hernia: No hernia is present.  Genitourinary:    Penis: Normal.      Scrotum/Testes: Cremasteric reflex is present.        Right: Mass, tenderness or swelling not present.        Left: Tenderness present. Mass or swelling not present.  Musculoskeletal: Normal range of motion.  Skin:    General: Skin is warm and dry.     Findings: No rash.  Neurological:     Mental Status: He is alert and oriented to person, place, and time.     GCS: GCS eye subscore is 4. GCS verbal subscore is 5. GCS motor subscore is 6.     Cranial Nerves: No cranial nerve deficit.     Sensory: No sensory deficit.     Coordination: Coordination normal.  Psychiatric:        Speech: Speech normal.        Behavior: Behavior normal.        Thought Content: Thought content normal.      ED Treatments / Results  Labs (all labs ordered are listed, but only abnormal results are displayed) Labs Reviewed  URINALYSIS, ROUTINE W REFLEX MICROSCOPIC  GC/CHLAMYDIA PROBE AMP ([place] NOT [facility]    EKG None  Radiology No results found.  Procedures Procedures (including critical care time)  Medications Ordered in ED Medications  cefTRIAXone (ROCEPHIN) injection 250 mg (has no administration in time range)  azithromycin (ZITHROMAX) tablet 1,000 mg (has no administration in time range)     Initial Impression / Assessment and Plan / ED Course  I have reviewed the triage vital signs and the nursing notes.  Pertinent labs & imaging results that were available during my care of the patient were reviewed by me and considered in my  medical decision making (see chart for details).        Patient with 2-week history of left testicle pain.  Pain waxing and waning and at times intermittent.  Pain more severe earlier today but now has significantly improved.  Cannot rule out intermittent torsion, but  I do doubt torsion at this time.  Examination with some very mild tenderness but no swelling or other abnormality noted.  As this has been ongoing for 2 weeks I do not feel an urgency for ultrasound.  Ultrasound not present in the ER tonight.  [facility], however ultimately the timing of this would likely not get done any sooner than doing it here first thing in the morning.  Also offered patient to stay here in the ER until morning to have test performed but he would prefer to go home and come back.  Urinalysis and GC chlamydia pending, treat empirically with Rocephin and doxycycline, return in a.m. for ultrasound.  Final Clinical Impressions(s) / ED Diagnoses   Final diagnoses:  Pain in left testicle    ED Discharge Orders         Ordered    US Scrotum     03/02/19 0321    US SCROTUM DOPPLER     03/02/19 0321    levofloxacin (LEVAQUIN) 750 MG tablet  Daily     03/02/19 0321           Gilda CreasePollina, Taja Pentland J, MD 03/02/19 [phone]

## 2019-03-02 NOTE — ED Provider Notes (Signed)
Pt did return for [place].  No torsion.  Tr bilateral hydroceles.  Nothing abn on the left testicle.  Pt told to f/u with urology if sx continue.  Return if worse.   Isla Pence, MD 03/02/19 [phone]

## 2019-03-02 NOTE — ED Triage Notes (Signed)
Patient presents with complaints of left testicle pain; onset 2 weeks ago; denies any known injury; denies urinary sx. nad noted.

## 2019-03-02 NOTE — Discharge Instructions (Signed)
The most likely cause of your pain is infection.  We have started treatment for infection with antibiotics tonight.  We have not, however, pulled out twisting, or torsion, of the testicle.  This can be a very serious cause of testicular pain and needs to be evaluated by ultrasound.  We will do that ultrasound at 8 AM this morning.  Please return for this ultrasound.

## 2019-03-03 LAB — GC/CHLAMYDIA PROBE AMP (~~LOC~~) NOT AT ARMC
Chlamydia: NEGATIVE
Neisseria Gonorrhea: NEGATIVE

## 2019-05-12 ENCOUNTER — Emergency Department (HOSPITAL_BASED_OUTPATIENT_CLINIC_OR_DEPARTMENT_OTHER): Payer: Federal, State, Local not specified - PPO

## 2019-05-12 ENCOUNTER — Emergency Department (HOSPITAL_BASED_OUTPATIENT_CLINIC_OR_DEPARTMENT_OTHER)
Admission: EM | Admit: 2019-05-12 | Discharge: 2019-05-13 | Disposition: A | Discharge status: Home / Self Care | Payer: Federal, State, Local not specified - PPO | Attending: Emergency Medicine | Admitting: Emergency Medicine

## 2019-05-12 ENCOUNTER — Encounter (HOSPITAL_BASED_OUTPATIENT_CLINIC_OR_DEPARTMENT_OTHER): Payer: Self-pay | Admitting: *Deleted

## 2019-05-12 ENCOUNTER — Other Ambulatory Visit: Payer: Self-pay

## 2019-05-12 DIAGNOSIS — Z880 Allergy status to penicillin: Secondary | ICD-10-CM | POA: Diagnosis not present

## 2019-05-12 DIAGNOSIS — R079 Chest pain, unspecified: Secondary | ICD-10-CM

## 2019-05-12 DIAGNOSIS — R0789 Other chest pain: Secondary | ICD-10-CM | POA: Insufficient documentation

## 2019-05-12 DIAGNOSIS — R091 Pleurisy: Secondary | ICD-10-CM | POA: Diagnosis not present

## 2019-05-12 DIAGNOSIS — Z888 Allergy status to other drugs, medicaments and biological substances status: Secondary | ICD-10-CM | POA: Insufficient documentation

## 2019-05-12 DIAGNOSIS — Z87891 Personal history of nicotine dependence: Secondary | ICD-10-CM | POA: Diagnosis not present

## 2019-05-12 DIAGNOSIS — Z20828 Contact with and (suspected) exposure to other viral communicable diseases: Secondary | ICD-10-CM | POA: Insufficient documentation

## 2019-05-12 DIAGNOSIS — Z91013 Allergy to seafood: Secondary | ICD-10-CM | POA: Diagnosis not present

## 2019-05-12 DIAGNOSIS — J45909 Unspecified asthma, uncomplicated: Secondary | ICD-10-CM | POA: Insufficient documentation

## 2019-05-12 DIAGNOSIS — R0602 Shortness of breath: Secondary | ICD-10-CM | POA: Diagnosis present

## 2019-05-12 MED ORDER — PREDNISONE 50 MG PO TABS
60.0000 mg | ORAL_TABLET | Freq: Once | ORAL | Status: AC
  Administered 2019-05-13: 60 mg via ORAL
  Filled 2019-05-12: qty 1

## 2019-05-12 MED ORDER — ASPIRIN 81 MG PO CHEW
324.0000 mg | CHEWABLE_TABLET | Freq: Once | ORAL | Status: AC
  Administered 2019-05-13: 324 mg via ORAL
  Filled 2019-05-12: qty 4

## 2019-05-12 MED ORDER — KETOROLAC TROMETHAMINE 30 MG/ML IJ SOLN
15.0000 mg | Freq: Once | INTRAMUSCULAR | Status: AC
  Administered 2019-05-13: 15 mg via INTRAVENOUS
  Filled 2019-05-12: qty 1

## 2019-05-12 NOTE — ED Triage Notes (Signed)
Pt c/o generalized weakness and SOb x 2 weeks , also c/o chest tightness x 1 week

## 2019-05-12 NOTE — ED Notes (Signed)
ED Provider at bedside. 

## 2019-05-13 ENCOUNTER — Emergency Department (HOSPITAL_BASED_OUTPATIENT_CLINIC_OR_DEPARTMENT_OTHER): Payer: Federal, State, Local not specified - PPO

## 2019-05-13 LAB — CBC
HCT: 42.3 % (ref 39.0–52.0)
Hemoglobin: 13.5 g/dL (ref 13.0–17.0)
MCH: 27.7 pg (ref 26.0–34.0)
MCHC: 31.9 g/dL (ref 30.0–36.0)
MCV: 86.9 fL (ref 80.0–100.0)
Platelets: 206 10*3/uL (ref 150–400)
RBC: 4.87 MIL/uL (ref 4.22–5.81)
RDW: 12.8 % (ref 11.5–15.5)
WBC: 8.5 10*3/uL (ref 4.0–10.5)
nRBC: 0 % (ref 0.0–0.2)

## 2019-05-13 LAB — COMPREHENSIVE METABOLIC PANEL
ALT: 26 U/L (ref 0–44)
AST: 31 U/L (ref 15–41)
Albumin: 4.1 g/dL (ref 3.5–5.0)
Alkaline Phosphatase: 64 U/L (ref 38–126)
Anion gap: 11 (ref 5–15)
BUN: 14 mg/dL (ref 6–20)
CO2: 25 mmol/L (ref 22–32)
Calcium: 9.5 mg/dL (ref 8.9–10.3)
Chloride: 103 mmol/L (ref 98–111)
Creatinine, Ser: 1.22 mg/dL (ref 0.61–1.24)
GFR calc Af Amer: >60 mL/min (ref >60)
GFR calc non Af Amer: >60 mL/min (ref >60)
Glucose, Bld: 128 mg/dL — ABNORMAL HIGH (ref 70–99)
Potassium: 3.6 mmol/L (ref 3.5–5.1)
Sodium: 139 mmol/L (ref 135–145)
Total Bilirubin: 0.6 mg/dL (ref 0.3–1.2)
Total Protein: 7 g/dL (ref 6.5–8.1)

## 2019-05-13 LAB — LIPASE, BLOOD: Lipase: 25 U/L (ref 11–51)

## 2019-05-13 LAB — SARS CORONAVIRUS 2 (TAT 6-24 HRS): SARS Coronavirus 2: NEGATIVE

## 2019-05-13 MED ORDER — LACTATED RINGERS IV BOLUS
1000.0000 mL | Freq: Once | INTRAVENOUS | Status: AC
  Administered 2019-05-13: 1000 mL via INTRAVENOUS

## 2019-05-13 MED ORDER — IOHEXOL 350 MG/ML SOLN
100.0000 mL | Freq: Once | INTRAVENOUS | Status: AC | PRN
  Administered 2019-05-13: 04:00:00 100 mL via INTRAVENOUS

## 2019-05-13 MED ORDER — IBUPROFEN 400 MG PO TABS: 400.0000 mg | ORAL_TABLET | Freq: Four times a day (QID) | ORAL | 0 refills | Status: DC | PRN

## 2019-05-13 MED ORDER — PREDNISONE 20 MG PO TABS: ORAL_TABLET | ORAL | 0 refills | Status: DC

## 2019-05-13 NOTE — ED Notes (Signed)
Pt is in CT

## 2019-05-13 NOTE — ED Notes (Signed)
EDP notified of pt's blood pressure  

## 2019-05-13 NOTE — ED Notes (Signed)
Pt's blood pressure reading low  Adjusted pt's cuff and rechecked

## 2019-05-13 NOTE — ED Notes (Signed)
Pt given sprite 

## 2019-05-13 NOTE — ED Provider Notes (Signed)
[facility] EMERGENCY DEPARTMENT Provider Note   CSN: [id number] Arrival date & time: 05/12/19  2316     History   Chief Complaint Chief Complaint  Patient presents with  . Shortness of Breath    [name] is a 23 y.o. male.      Shortness of Breath Severity:  Mild Duration:  5 days Timing:  Constant Progression:  Waxing and waning Chronicity:  New Context: not activity, not emotional upset, not pollens and not URI   Relieved by:  None tried Worsened by:  Deep breathing and movement Ineffective treatments:  Inhaler Associated symptoms: chest pain   Associated symptoms: no abdominal pain, no fever, no sore throat and no sputum production     Past Medical History:  Diagnosis Date  . ADHD (attention deficit hyperactivity disorder)   . Asthma   . Depression   . Eczema   . GERD (gastroesophageal reflux disease)   . Seasonal allergies     Patient Active Problem List   Diagnosis Date Noted  . Tobacco use disorder 09/13/2014  . Appendicitis, acute 09/14/2013  . Epiploic appendagitis 08/19/2013    Past Surgical History:  Procedure Laterality Date  . ADENOIDECTOMY    . APPENDECTOMY    . LAPAROSCOPIC APPENDECTOMY N/A 08/19/2013   Procedure: APPENDECTOMY LAPAROSCOPIC;  Surgeon: Shelly Rubenstein, MD;  Location: [facility] OR;  Service: General;  Laterality: N/A;  . TONSILLECTOMY          Home Medications    Prior to Admission medications   Medication Sig Start Date End Date Taking? Authorizing Provider  cyclobenzaprine (FLEXERIL) 10 MG tablet Take 1 tablet (10 mg total) by mouth 2 (two) times daily as needed for muscle spasms. 09/11/17   Luevenia Maxin, Mina A, PA-C  guaiFENesin (MUCINEX) 600 MG 12 hr tablet Take 1,200 mg by mouth 2 (two) times daily as needed for cough.    [provider]  ibuprofen (ADVIL) 400 MG tablet Take 1 tablet (400 mg total) by mouth every 6 (six) hours as needed. 05/13/19   Sonja Manseau, Barbara Cower, MD  levofloxacin (LEVAQUIN) 750  MG tablet Take 1 tablet (750 mg total) by mouth daily. 03/02/19   Gilda Crease, MD  predniSONE (DELTASONE) 20 MG tablet 3 tabs po daily x 3 days, then 2 tabs x 3 days, then 1.5 tabs x 3 days, then 1 tab x 3 days, then 0.5 tabs x 3 days 05/13/19   Brynley Cuddeback, Barbara Cower, MD    Family History Family History  Problem Relation Age of Onset  . Cancer Paternal Grandmother        ovarian    Social History Social History   Tobacco Use  . Smoking status: Former Smoker    Packs/day: 0.25    Types: Cigarettes    Quit date: 04/10/2015    Years since quitting: 4.0  . Smokeless tobacco: Never Used  Substance Use Topics  . Alcohol use: Yes  . Drug use: Yes    Types: Marijuana     Allergies   Minocin [minocycline hcl], Shellfish allergy, Amoxicillin-pot clavulanate, and Penicillins   Review of Systems Review of Systems  Constitutional: Negative for fever.  HENT: Negative for sore throat.   Respiratory: Positive for shortness of breath. Negative for sputum production.   Cardiovascular: Positive for chest pain.  Gastrointestinal: Negative for abdominal pain.  All other systems reviewed and are negative.    Physical Exam Updated Vital Signs BP 107/73   Pulse 67   Temp 98.2  F (36.8 C) (Oral)   Resp 17   Ht 6' (1.829 m)   Wt 108 kg   SpO2 100%   BMI 32.28 kg/m   Physical Exam Vitals signs and nursing note reviewed.  Constitutional:      Appearance: He is well-developed.  HENT:     Head: Normocephalic and atraumatic.  Neck:     Musculoskeletal: Normal range of motion.  Cardiovascular:     Rate and Rhythm: Normal rate.  Pulmonary:     Effort: Pulmonary effort is normal. No respiratory distress.     Breath sounds: No decreased breath sounds.  Chest:     Chest wall: No mass or tenderness.     Comments: Severe with deep breath Abdominal:     General: There is no distension.     Palpations: Abdomen is soft.  Musculoskeletal: Normal range of motion.  Skin:    General:  Skin is warm and dry.     Findings: No rash.  Neurological:     General: No focal deficit present.     Mental Status: He is alert.      ED Treatments / Results  Labs (all labs ordered are listed, but only abnormal results are displayed) Labs Reviewed  COMPREHENSIVE METABOLIC PANEL - Abnormal; Notable for the following components:      Result Value   Glucose, Bld 128 (*)    All other components within normal limits  SARS CORONAVIRUS 2 (TAT 6-24 HRS)  CBC  LIPASE, BLOOD  CBG MONITORING, ED    EKG EKG Interpretation  Date/Time:  Thursday May 13 2019 00:10:50 EDT Ventricular Rate:  80 PR Interval:    QRS Duration: 114 QT Interval:  369 QTC Calculation: 426 R Axis:   22 Text Interpretation:  Sinus rhythm Incomplete right bundle branch block Borderline ST elevation, anterior leads No significant change since last tracing Confirmed by Marily MemosMesner, Mala Gibbard [phone]([id number]) on 05/13/2019 2:37:58 AM   Radiology Dg Chest Port 1 View  Result Date: 05/13/2019 CLINICAL DATA:  Weakness and shortness of breath EXAM: PORTABLE CHEST 1 VIEW COMPARISON:  09/11/2017 FINDINGS: The heart size and mediastinal contours are within normal limits. Both lungs are clear. The visualized skeletal structures are unremarkable. IMPRESSION: No active disease. Electronically Signed   By: Deatra RobinsonKevin  Herman M.D.   On: 05/13/2019 00:48   Ct Angio Chest Aorta W And/or Wo Contrast  Result Date: 05/13/2019 CLINICAL DATA:  Complex chest pain with intermediate/high probability of ACS EXAM: CT ANGIOGRAPHY CHEST WITH CONTRAST TECHNIQUE: Multidetector CT imaging of the chest was performed using the standard protocol during bolus administration of intravenous contrast. Multiplanar CT image reconstructions and MIPs were obtained to evaluate the vascular anatomy. CONTRAST:  100mL OMNIPAQUE IOHEXOL 350 MG/ML SOLN COMPARISON:  None. FINDINGS: Cardiovascular: Noncontrast phase shows no intramural aortic hematoma. Postcontrast imaging shows  normal aorta with 4 vessel arch branching. There is no aortic aneurysm or dissection. Left and right coronary arteries are enhancing. Opacified segments of the pulmonary arterial tree shows no filling defects. Normal heart size. No pericardial effusion Mediastinum/Nodes: Negative for adenopathy or pneumomediastinum. Age normal thymus. Lungs/Pleura: Minimal dependent atelectasis. Subpleural right middle lobe nodule has the appearance of lymph node. There is no edema, consolidation, effusion, or pneumothorax. Upper Abdomen: Relatively low-density liver is likely from contrast timing. Musculoskeletal: No explanation for chest pain. Review of the MIP images confirms the above findings. IMPRESSION: Negative chest CTA. Electronically Signed   By: Marnee SpringJonathon  Watts M.D.   On: 05/13/2019 04:20  Procedures Procedures (including critical care time)  Medications Ordered in ED Medications  aspirin chewable tablet 324 mg (324 mg Oral Given 05/13/19 0012)  ketorolac (TORADOL) 30 MG/ML injection 15 mg (15 mg Intravenous Given 05/13/19 0016)  predniSONE (DELTASONE) tablet 60 mg (60 mg Oral Given 05/13/19 0013)  lactated ringers bolus 1,000 mL ( Intravenous Stopped 05/13/19 0507)  iohexol (OMNIPAQUE) 350 MG/ML injection 100 mL (100 mLs Intravenous Contrast Given 05/13/19 0347)     Initial Impression / Assessment and Plan / ED Course  I have reviewed the triage vital signs and the nursing notes.  Pertinent labs & imaging results that were available during my care of the patient were reviewed by me and considered in my medical decision making (see chart for details).        Work-up ultimately negative.  Patient's pain significantly improved in the emergency room.  No evidence of infection.  No evidence of blood clot.  No evidence of coronavirus.  Patient seems to have pleuritic chest pain of unknown etiology.  Will continue treatment at home return here if not improved in a few days otherwise follow-up with PCP.   Final Clinical Impressions(s) / ED Diagnoses   Final diagnoses:  Chest pain  Pleurisy    ED Discharge Orders         Ordered    ibuprofen (ADVIL) 400 MG tablet  Every 6 hours PRN     05/13/19 0448    predniSONE (DELTASONE) 20 MG tablet     05/13/19 0448           Miniya Miguez, Corene Cornea, MD 05/13/19 [phone]

## 2019-06-28 ENCOUNTER — Emergency Department (HOSPITAL_BASED_OUTPATIENT_CLINIC_OR_DEPARTMENT_OTHER): Payer: Federal, State, Local not specified - PPO

## 2019-06-28 ENCOUNTER — Emergency Department (HOSPITAL_BASED_OUTPATIENT_CLINIC_OR_DEPARTMENT_OTHER)
Admission: EM | Admit: 2019-06-28 | Discharge: 2019-06-29 | Disposition: A | Discharge status: Home / Self Care | Payer: Federal, State, Local not specified - PPO | Attending: Emergency Medicine | Admitting: Emergency Medicine

## 2019-06-28 ENCOUNTER — Other Ambulatory Visit: Payer: Self-pay

## 2019-06-28 ENCOUNTER — Encounter (HOSPITAL_BASED_OUTPATIENT_CLINIC_OR_DEPARTMENT_OTHER): Payer: Self-pay | Admitting: *Deleted

## 2019-06-28 DIAGNOSIS — Y999 Unspecified external cause status: Secondary | ICD-10-CM | POA: Insufficient documentation

## 2019-06-28 DIAGNOSIS — X58XXXA Exposure to other specified factors, initial encounter: Secondary | ICD-10-CM | POA: Insufficient documentation

## 2019-06-28 DIAGNOSIS — Y929 Unspecified place or not applicable: Secondary | ICD-10-CM | POA: Diagnosis not present

## 2019-06-28 DIAGNOSIS — Z87891 Personal history of nicotine dependence: Secondary | ICD-10-CM | POA: Diagnosis not present

## 2019-06-28 DIAGNOSIS — Y939 Activity, unspecified: Secondary | ICD-10-CM | POA: Diagnosis not present

## 2019-06-28 DIAGNOSIS — J45909 Unspecified asthma, uncomplicated: Secondary | ICD-10-CM | POA: Insufficient documentation

## 2019-06-28 DIAGNOSIS — S51002A Unspecified open wound of left elbow, initial encounter: Secondary | ICD-10-CM | POA: Diagnosis not present

## 2019-06-28 DIAGNOSIS — Z79899 Other long term (current) drug therapy: Secondary | ICD-10-CM | POA: Insufficient documentation

## 2019-06-28 DIAGNOSIS — S59902A Unspecified injury of left elbow, initial encounter: Secondary | ICD-10-CM | POA: Diagnosis present

## 2019-06-28 DIAGNOSIS — L03114 Cellulitis of left upper limb: Secondary | ICD-10-CM | POA: Insufficient documentation

## 2019-06-28 LAB — COMPREHENSIVE METABOLIC PANEL
ALT: 21 U/L (ref 0–44)
AST: 20 U/L (ref 15–41)
Albumin: 4.4 g/dL (ref 3.5–5.0)
Alkaline Phosphatase: 73 U/L (ref 38–126)
Anion gap: 10 (ref 5–15)
BUN: 8 mg/dL (ref 6–20)
CO2: 24 mmol/L (ref 22–32)
Calcium: 9.6 mg/dL (ref 8.9–10.3)
Chloride: 105 mmol/L (ref 98–111)
Creatinine, Ser: 1.05 mg/dL (ref 0.61–1.24)
GFR calc Af Amer: >60 mL/min (ref >60)
GFR calc non Af Amer: >60 mL/min (ref >60)
Glucose, Bld: 109 mg/dL — ABNORMAL HIGH (ref 70–99)
Potassium: 3.6 mmol/L (ref 3.5–5.1)
Sodium: 139 mmol/L (ref 135–145)
Total Bilirubin: 1 mg/dL (ref 0.3–1.2)
Total Protein: 7.3 g/dL (ref 6.5–8.1)

## 2019-06-28 LAB — CBC WITH DIFFERENTIAL/PLATELET
Abs Immature Granulocytes: 0.03 10*3/uL (ref 0.00–0.07)
Basophils Absolute: 0 10*3/uL (ref 0.0–0.1)
Basophils Relative: 0 %
Eosinophils Absolute: 0.1 10*3/uL (ref 0.0–0.5)
Eosinophils Relative: 1 %
HCT: 43.8 % (ref 39.0–52.0)
Hemoglobin: 13.8 g/dL (ref 13.0–17.0)
Immature Granulocytes: 0 %
Lymphocytes Relative: 23 %
Lymphs Abs: 2.2 10*3/uL (ref 0.7–4.0)
MCH: 27.7 pg (ref 26.0–34.0)
MCHC: 31.5 g/dL (ref 30.0–36.0)
MCV: 88 fL (ref 80.0–100.0)
Monocytes Absolute: 0.7 10*3/uL (ref 0.1–1.0)
Monocytes Relative: 7 %
Neutro Abs: 6.8 10*3/uL (ref 1.7–7.7)
Neutrophils Relative %: 69 %
Platelets: 221 10*3/uL (ref 150–400)
RBC: 4.98 MIL/uL (ref 4.22–5.81)
RDW: 13.2 % (ref 11.5–15.5)
WBC: 9.9 10*3/uL (ref 4.0–10.5)
nRBC: 0 % (ref 0.0–0.2)

## 2019-06-28 LAB — LACTIC ACID, PLASMA: Lactic Acid, Venous: 1 mmol/L (ref 0.5–1.9)

## 2019-06-28 MED ORDER — ACETAMINOPHEN 500 MG PO TABS
ORAL_TABLET | ORAL | Status: AC
  Filled 2019-06-28: qty 2

## 2019-06-28 MED ORDER — ACETAMINOPHEN 500 MG PO TABS
1000.0000 mg | ORAL_TABLET | Freq: Once | ORAL | Status: AC
  Administered 2019-06-28: 1000 mg via ORAL

## 2019-06-28 MED ORDER — LIDOCAINE-EPINEPHRINE 1 %-1:100000 IJ SOLN
INTRAMUSCULAR | Status: AC
  Filled 2019-06-28: qty 1

## 2019-06-28 NOTE — ED Triage Notes (Signed)
Pt co abscess/ insect bite ? To left elbow x 1 day , pain x 3 days

## 2019-06-29 ENCOUNTER — Emergency Department (HOSPITAL_COMMUNITY)
Admission: EM | Admit: 2019-06-29 | Discharge: 2019-06-29 | Discharge status: Absent without leave | Payer: Federal, State, Local not specified - PPO | Source: Home / Self Care

## 2019-06-29 ENCOUNTER — Other Ambulatory Visit: Payer: Self-pay

## 2019-06-29 MED ORDER — SODIUM CHLORIDE 0.9 % IV SOLN
2.0000 g | Freq: Once | INTRAVENOUS | Status: AC
  Administered 2019-06-29: 01:00:00 2 g via INTRAVENOUS

## 2019-06-29 MED ORDER — CEPHALEXIN 500 MG PO CAPS: 500.0000 mg | ORAL_CAPSULE | Freq: Three times a day (TID) | ORAL | 0 refills | Status: AC

## 2019-06-29 MED ORDER — SODIUM CHLORIDE 0.9 % IV SOLN
INTRAVENOUS | Status: AC
  Filled 2019-06-29: qty 20

## 2019-06-29 MED FILL — CEPHALEXIN 500 MG CAPSULE: 500 | 10 days supply | Qty: 30 | Fill #0

## 2019-06-29 NOTE — ED Provider Notes (Signed)
[facility] Provider Note  CSN: [id number] Arrival date & time: 06/28/19 2143  Chief Complaint(s) Abscess  [name] is a 23 y.o. male who presents to the emergency department with 2 days of gradually worsening left elbow pain and swelling.  Pain is not deep aching, worse with palpation.  No alleviating factors.  Patient endorses noticing a pustule earlier today coming to ahead and popping it.  There was purulent discharge coming from it.  Patient denies any trauma.  Denies any history of STDs.  Endorses subjective fevers.  No cough congestion.  No nausea or vomiting.  No known sick contacts.  No other physical complaints.  Denies any IV drug use.  HPI  Past Medical History Past Medical History:  Diagnosis Date   ADHD (attention deficit hyperactivity disorder)    Asthma    Depression    Eczema    GERD (gastroesophageal reflux disease)    Seasonal allergies    Patient Active Problem List   Diagnosis Date Noted   Tobacco use disorder 09/13/2014   Appendicitis, acute 09/14/2013   Epiploic appendagitis 08/19/2013   Home Medication(s) Prior to Admission medications   Medication Sig Start Date End Date Taking? Authorizing Provider  cephALEXin (KEFLEX) 500 MG capsule Take 1 capsule (500 mg total) by mouth 3 (three) times daily for 10 days. 06/29/19 07/09/19  Fatima Blank, MD  cyclobenzaprine (FLEXERIL) 10 MG tablet Take 1 tablet (10 mg total) by mouth 2 (two) times daily as needed for muscle spasms. 09/11/17   Nils Flack, Mina A, PA-C  guaiFENesin (MUCINEX) 600 MG 12 hr tablet Take 1,200 mg by mouth 2 (two) times daily as needed for cough.    [provider]  ibuprofen (ADVIL) 400 MG tablet Take 1 tablet (400 mg total) by mouth every 6 (six) hours as needed. 05/13/19   Mesner, Corene Cornea, MD  levofloxacin (LEVAQUIN) 750 MG tablet Take 1 tablet (750 mg total) by mouth daily. 03/02/19   Orpah Greek, MD  predniSONE  (DELTASONE) 20 MG tablet 3 tabs po daily x 3 days, then 2 tabs x 3 days, then 1.5 tabs x 3 days, then 1 tab x 3 days, then 0.5 tabs x 3 days 05/13/19   Mesner, Corene Cornea, MD                                                                                                                                    Past Surgical History Past Surgical History:  Procedure Laterality Date   ADENOIDECTOMY     APPENDECTOMY     LAPAROSCOPIC APPENDECTOMY N/A 08/19/2013   Procedure: APPENDECTOMY LAPAROSCOPIC;  Surgeon: Harl Bowie, MD;  Location: [facility];  Service: General;  Laterality: N/A;   TONSILLECTOMY     Family History Family History  Problem Relation Age of Onset   Cancer Paternal Grandmother        ovarian  Social History Social History   Tobacco Use   Smoking status: Former Smoker    Packs/day: 0.25    Types: Cigarettes    Quit date: 04/10/2015    Years since quitting: 4.2   Smokeless tobacco: Never Used  Substance Use Topics   Alcohol use: Yes   Drug use: Yes    Types: Marijuana   Allergies Minocin [minocycline hcl], Shellfish allergy, Amoxicillin-pot clavulanate, and Penicillins  Review of Systems Review of Systems All other systems are reviewed and are negative for acute change except as noted in the HPI  Physical Exam Vital Signs  I have reviewed the triage vital signs BP (!) 151/93    Pulse (!) 102    Temp (!) 100.7 F (38.2 C) (Oral)    Resp 16    Ht 6' (1.829 m)    Wt 108.9 kg    SpO2 100%    BMI 32.55 kg/m   Physical Exam Vitals signs reviewed.  Constitutional:      General: He is not in acute distress.    Appearance: He is well-developed. He is not diaphoretic.  HENT:     Head: Normocephalic and atraumatic.     Jaw: No trismus.     Right Ear: External ear normal.     Left Ear: External ear normal.     Nose: Nose normal.  Eyes:     General: No scleral icterus.    Conjunctiva/sclera: Conjunctivae normal.  Neck:     Musculoskeletal: Normal range of  motion.     Trachea: Phonation normal.  Cardiovascular:     Rate and Rhythm: Normal rate and regular rhythm.  Pulmonary:     Effort: Pulmonary effort is normal. No respiratory distress.     Breath sounds: No stridor.  Abdominal:     General: There is no distension.  Musculoskeletal: Normal range of motion.     Left forearm: He exhibits tenderness and swelling. He exhibits no bony tenderness, no deformity and no laceration.       Arms:  Neurological:     Mental Status: He is alert and oriented to person, place, and time.  Psychiatric:        Behavior: Behavior normal.     ED Results and Treatments Labs (all labs ordered are listed, but only abnormal results are displayed) Labs Reviewed  COMPREHENSIVE METABOLIC PANEL - Abnormal; Notable for the following components:      Result Value   Glucose, Bld 109 (*)    All other components within normal limits  CULTURE, BLOOD (ROUTINE X 2)  CULTURE, BLOOD (ROUTINE X 2)  CBC WITH DIFFERENTIAL/PLATELET  LACTIC ACID, PLASMA  LACTIC ACID, PLASMA                                                                                                                         EKG  EKG Interpretation  Date/Time:    Ventricular Rate:    PR Interval:    QRS Duration:  QT Interval:    QTC Calculation:   R Axis:     Text Interpretation:        Radiology Dg Elbow Complete Left  Result Date: 06/28/2019 CLINICAL DATA:  Abscess, insect bite EXAM: LEFT ELBOW - COMPLETE 3+ VIEW COMPARISON:  None. FINDINGS: No fracture or malalignment. No significant elbow effusion. Prominent soft tissue swelling over the posterior elbow IMPRESSION: Soft tissue edema and prominence over the posterior elbow. No acute osseous abnormality Electronically Signed   By: Jasmine PangKim  Fujinaga M.D.   On: 06/28/2019 22:17    Pertinent labs & imaging results that were available during my care of the patient were reviewed by me and considered in my medical decision making (see chart for  details).  Medications Ordered in ED Medications  acetaminophen (TYLENOL) tablet 1,000 mg (1,000 mg Oral Given 06/28/19 2154)  cefTRIAXone (ROCEPHIN) 2 g in sodium chloride 0.9 % 100 mL IVPB (0 g Intravenous Stopped 06/29/19 0219)                                                                                                                                    Procedures Ultrasound ED Soft Tissue  Date/Time: 06/29/2019 3:23 AM Performed by: Nira Connardama, Billiejo Sorto Eduardo, MD Authorized by: Nira Connardama, Coleta Grosshans Eduardo, MD   Procedure details:    Indications: limb pain and evaluate for cellulitis     Transverse view:  Visualized   Longitudinal view:  Visualized   Images: archived   Location:    Location: upper extremity     Side:  Left Findings:     no abscess present    cellulitis present    (including critical care time)  Medical Decision Making / ED Course I have reviewed the nursing notes for this encounter and the patient's prior records (if available in EHR or on provided paperwork).   Madelynn Doneyrese D Lindaman was evaluated in Emergency Department on 06/29/2019 for the symptoms described in the history of present illness. He was evaluated in the context of the global COVID-19 pandemic, which necessitated consideration that the patient might be at risk for infection with the SARS-CoV-2 virus that causes COVID-19. Institutional protocols and algorithms that pertain to the evaluation of patients at risk for COVID-19 are in a state of rapid change based on information released by regulatory bodies including the CDC and federal and state organizations. These policies and algorithms were followed during the patient's care in the ED.  Cellulitis versus bursitis.  No obvious abscess noted on ultrasound.  Full range of motion of the joint without significant pain.  No effusion on ultrasound or plain film.  Doubt septic arthritis.  Patient noted to have low-grade fever.  Rest of the labs reassuring without  leukocytosis.  We will treat with antibiotics.  IV Rocephin given in the emergency department.  Appropriate for outpatient management.   .The patient appears reasonably screened and/or stabilized for discharge and I doubt any  other medical condition or other [facility] requiring further screening, evaluation, or treatment in the ED at this time prior to discharge.  The patient is safe for discharge with strict return precautions.       Final Clinical Impression(s) / ED Diagnoses Final diagnoses:  Cellulitis of left upper extremity    The patient appears reasonably screened and/or stabilized for discharge and I doubt any other medical condition or other [facility] requiring further screening, evaluation, or treatment in the ED at this time prior to discharge.  Disposition: Discharge  Condition: Good  I have discussed the results, Dx and Tx plan with the patient who expressed understanding and agree(s) with the plan. Discharge instructions discussed at great length. The patient was given strict return precautions who verbalized understanding of the instructions. No further questions at time of discharge.    ED Discharge Orders         Ordered    cephALEXin (KEFLEX) 500 MG capsule  3 times daily     06/29/19 0209           Follow Up: [facility] [address] [place] [address] [phone] Schedule an appointment as soon as possible for a visit  in 5-7 days, For close follow up to assess for progress      This chart was dictated using voice recognition software.  Despite best efforts to proofread,  errors can occur which can change the documentation meaning.   Nira Conn, MD 06/29/19 [phone]

## 2019-07-04 LAB — CULTURE, BLOOD (ROUTINE X 2)
Culture: NO GROWTH
Culture: NO GROWTH
Special Requests: ADEQUATE
Special Requests: ADEQUATE

## 2020-01-13 ENCOUNTER — Emergency Department (HOSPITAL_BASED_OUTPATIENT_CLINIC_OR_DEPARTMENT_OTHER)
Admission: EM | Admit: 2020-01-13 | Discharge: 2020-01-13 | Disposition: A | Discharge status: Home / Self Care | Payer: Federal, State, Local not specified - PPO | Attending: Emergency Medicine | Admitting: Emergency Medicine

## 2020-01-13 ENCOUNTER — Other Ambulatory Visit: Payer: Self-pay

## 2020-01-13 ENCOUNTER — Encounter (HOSPITAL_BASED_OUTPATIENT_CLINIC_OR_DEPARTMENT_OTHER): Payer: Self-pay | Admitting: *Deleted

## 2020-01-13 DIAGNOSIS — Z87891 Personal history of nicotine dependence: Secondary | ICD-10-CM | POA: Diagnosis not present

## 2020-01-13 DIAGNOSIS — Z202 Contact with and (suspected) exposure to infections with a predominantly sexual mode of transmission: Secondary | ICD-10-CM | POA: Insufficient documentation

## 2020-01-13 DIAGNOSIS — Z711 Person with feared health complaint in whom no diagnosis is made: Secondary | ICD-10-CM

## 2020-01-13 LAB — URINALYSIS, ROUTINE W REFLEX MICROSCOPIC
Bilirubin Urine: NEGATIVE
Glucose, UA: NEGATIVE mg/dL
Hgb urine dipstick: NEGATIVE
Ketones, ur: NEGATIVE mg/dL
Leukocytes,Ua: NEGATIVE
Nitrite: NEGATIVE
Protein, ur: NEGATIVE mg/dL
Specific Gravity, Urine: 1.025 (ref 1.005–1.030)
pH: 6 (ref 5.0–8.0)

## 2020-01-13 NOTE — ED Notes (Signed)
Pt states was recently notified by sexual partner that she has hx herpes.  Pt states "doesn't feel right down there lately".  Denies any visible sores, denies urinary complaints.  Wishes to be screened for STDs

## 2020-01-13 NOTE — Discharge Instructions (Addendum)
Your urine was without any abnormalities today.  Please follow-up with the health department for thorough STI testing.

## 2020-01-13 NOTE — ED Provider Notes (Signed)
[facility] Provider Note   CSN: [id number] Arrival date & time: 01/13/20  1829     History Chief Complaint  Patient presents with  . SEXUALLY TRANSMITTED DISEASE    Phillip Leach is a 24 y.o. male.  HPI Patient states that his wife who is currently pregnant was recently told that her blood work showed herpes present.  This information was explained to him today by his wife prompted him to come to the emergency department for evaluation.  He denies any symptoms.  He states he is doing well denies any rashes, lesions, dysuria, frequency, urgency or penile discharge.  States he is feeling well apart from feeling somewhat anxious.      Past Medical History:  Diagnosis Date  . ADHD (attention deficit hyperactivity disorder)   . Asthma   . Depression   . Eczema   . GERD (gastroesophageal reflux disease)   . Seasonal allergies     Patient Active Problem List   Diagnosis Date Noted  . Tobacco use disorder 09/13/2014  . Appendicitis, acute 09/14/2013  . Epiploic appendagitis 08/19/2013    Past Surgical History:  Procedure Laterality Date  . ADENOIDECTOMY    . APPENDECTOMY    . LAPAROSCOPIC APPENDECTOMY N/A 08/19/2013   Procedure: APPENDECTOMY LAPAROSCOPIC;  Surgeon: Harl Bowie, MD;  Location: [facility];  Service: General;  Laterality: N/A;  . TONSILLECTOMY         Family History  Problem Relation Age of Onset  . Cancer Paternal Grandmother        ovarian    Social History   Tobacco Use  . Smoking status: Former Smoker    Packs/day: 0.25    Types: Cigarettes    Quit date: 04/10/2015    Years since quitting: 4.7  . Smokeless tobacco: Never Used  Substance Use Topics  . Alcohol use: Yes  . Drug use: Yes    Types: Marijuana    Home Medications Prior to Admission medications   Medication Sig Start Date End Date Taking? Authorizing Provider  cyclobenzaprine (FLEXERIL) 10 MG tablet Take 1 tablet (10 mg total) by mouth 2  (two) times daily as needed for muscle spasms. 09/11/17   Nils Flack, Mina A, PA-C  guaiFENesin (MUCINEX) 600 MG 12 hr tablet Take 1,200 mg by mouth 2 (two) times daily as needed for cough.    [provider]  ibuprofen (ADVIL) 400 MG tablet Take 1 tablet (400 mg total) by mouth every 6 (six) hours as needed. 05/13/19   Mesner, Corene Cornea, MD  levofloxacin (LEVAQUIN) 750 MG tablet Take 1 tablet (750 mg total) by mouth daily. 03/02/19   Orpah Greek, MD  predniSONE (DELTASONE) 20 MG tablet 3 tabs po daily x 3 days, then 2 tabs x 3 days, then 1.5 tabs x 3 days, then 1 tab x 3 days, then 0.5 tabs x 3 days 05/13/19   Mesner, Corene Cornea, MD    Allergies    Minocin [minocycline hcl], Shellfish allergy, Amoxicillin-pot clavulanate, and Penicillins  Review of Systems   Review of Systems  Constitutional: Negative for chills and fever.  HENT: Negative for congestion.   Gastrointestinal: Negative for abdominal pain, nausea and vomiting.  Genitourinary: Negative for decreased urine volume, discharge, dysuria, flank pain, frequency, genital sores, hematuria, penile pain, penile swelling, scrotal swelling, testicular pain and urgency.  Musculoskeletal: Negative for neck pain.    Physical Exam Updated Vital Signs BP 127/86 (BP Location: Right Arm)   Pulse 92  Temp 98.9 F (37.2 C) (Oral)   Resp 18   Ht 6' (1.829 m)   Wt 102.1 kg   SpO2 98%   BMI 30.52 kg/m   Physical Exam Vitals and nursing note reviewed.  Constitutional:      General: He is not in acute distress.    Appearance: Normal appearance. He is not ill-appearing.  HENT:     Head: Normocephalic and atraumatic.  Eyes:     General: No scleral icterus.       Right eye: No discharge.        Left eye: No discharge.     Conjunctiva/sclera: Conjunctivae normal.  Pulmonary:     Effort: Pulmonary effort is normal.     Breath sounds: No stridor.  Genitourinary:    Penis: Normal.      Testes: Normal.     Rectum: Normal.    Neurological:     Mental Status: He is alert and oriented to person, place, and time. Mental status is at baseline.     ED Results / Procedures / Treatments   Labs (all labs ordered are listed, but only abnormal results are displayed) Labs Reviewed  URINALYSIS, ROUTINE W REFLEX MICROSCOPIC  GC/CHLAMYDIA PROBE AMP ([place]) NOT AT [facility] [place]    EKG None  Radiology No results found.  Procedures Procedures (including critical care time)  Medications Ordered in ED Medications - No data to display  ED Course  I have reviewed the triage vital signs and the nursing notes.  Pertinent labs & imaging results that were available during my care of the patient were reviewed by me and considered in my medical decision making (see chart for details).    MDM Rules/Calculators/A&P                          Patient is asymptomatic 24 year old male presented today with concern for genital herpes because his wife had positive herpes blood work done for her pregnancy.  He has no other complaints.  He was requesting urinalysis and GC chlamydia testing.  This is obtained and is pending.  Urinalysis without any abnormalities.  I recommended close follow-up with the health department.  He is agreeable to plan.  Will discharge at this time.  Final Clinical Impression(s) / ED Diagnoses Final diagnoses:  Concern about sexually transmitted disease in male without diagnosis    Rx / DC Orders ED Discharge Orders    None       Gailen Shelter, [place] 01/13/20 2019    Maia Plan, MD 01/18/20 [phone]

## 2020-01-13 NOTE — ED Triage Notes (Signed)
Pt reports that his pregnant girlfriend was just dx with herpes and he requests std check. Denies any symptoms or c/o.

## 2020-01-17 LAB — GC/CHLAMYDIA PROBE AMP (~~LOC~~) NOT AT ARMC
Chlamydia: NEGATIVE
Comment: NEGATIVE
Comment: NORMAL
Neisseria Gonorrhea: NEGATIVE

## 2020-03-27 ENCOUNTER — Emergency Department (HOSPITAL_COMMUNITY)
Admission: EM | Admit: 2020-03-27 | Discharge: 2020-03-27 | Disposition: A | Discharge status: Home / Self Care | Payer: Federal, State, Local not specified - PPO | Attending: Emergency Medicine | Admitting: Emergency Medicine

## 2020-03-27 ENCOUNTER — Encounter (HOSPITAL_COMMUNITY): Payer: Self-pay

## 2020-03-27 ENCOUNTER — Other Ambulatory Visit: Payer: Self-pay

## 2020-03-27 DIAGNOSIS — Z5321 Procedure and treatment not carried out due to patient leaving prior to being seen by health care provider: Secondary | ICD-10-CM | POA: Insufficient documentation

## 2020-03-27 DIAGNOSIS — Z20822 Contact with and (suspected) exposure to covid-19: Secondary | ICD-10-CM | POA: Diagnosis present

## 2020-03-27 NOTE — ED Notes (Signed)
Called pt x2 for vitals, no response. °

## 2020-03-27 NOTE — ED Notes (Signed)
Pt called for vitals and no response

## 2020-03-27 NOTE — ED Triage Notes (Signed)
Patient concerned that he may have covid after exposure by infant. Patient with minimal to no symptoms and tested last week at MD office

## 2020-03-31 ENCOUNTER — Other Ambulatory Visit: Payer: Self-pay

## 2020-05-05 ENCOUNTER — Emergency Department (HOSPITAL_COMMUNITY): Payer: Federal, State, Local not specified - PPO

## 2020-05-05 ENCOUNTER — Emergency Department (HOSPITAL_COMMUNITY)
Admission: EM | Admit: 2020-05-05 | Discharge: 2020-05-05 | Disposition: A | Discharge status: Home / Self Care | Payer: Federal, State, Local not specified - PPO | Attending: Emergency Medicine | Admitting: Emergency Medicine

## 2020-05-05 ENCOUNTER — Encounter (HOSPITAL_COMMUNITY): Payer: Self-pay

## 2020-05-05 DIAGNOSIS — R000 Tachycardia, unspecified: Secondary | ICD-10-CM | POA: Insufficient documentation

## 2020-05-05 DIAGNOSIS — S21232A Puncture wound without foreign body of left back wall of thorax without penetration into thoracic cavity, initial encounter: Secondary | ICD-10-CM | POA: Insufficient documentation

## 2020-05-05 DIAGNOSIS — X58XXXA Exposure to other specified factors, initial encounter: Secondary | ICD-10-CM | POA: Diagnosis not present

## 2020-05-05 DIAGNOSIS — J45909 Unspecified asthma, uncomplicated: Secondary | ICD-10-CM | POA: Insufficient documentation

## 2020-05-05 DIAGNOSIS — Z87891 Personal history of nicotine dependence: Secondary | ICD-10-CM | POA: Insufficient documentation

## 2020-05-05 DIAGNOSIS — S21231A Puncture wound without foreign body of right back wall of thorax without penetration into thoracic cavity, initial encounter: Secondary | ICD-10-CM | POA: Diagnosis not present

## 2020-05-05 DIAGNOSIS — T07XXXA Unspecified multiple injuries, initial encounter: Secondary | ICD-10-CM

## 2020-05-05 DIAGNOSIS — S29002A Unspecified injury of muscle and tendon of back wall of thorax, initial encounter: Secondary | ICD-10-CM | POA: Diagnosis present

## 2020-05-05 LAB — HIV ANTIBODY (ROUTINE TESTING W REFLEX): HIV Screen 4th Generation wRfx: NONREACTIVE

## 2020-05-05 LAB — HEPATITIS PANEL, ACUTE
HCV Ab: NONREACTIVE
Hep A IgM: NONREACTIVE
Hep B C IgM: NONREACTIVE
Hepatitis B Surface Ag: NONREACTIVE

## 2020-05-05 MED ORDER — BACITRACIN ZINC 500 UNIT/GM EX OINT
1.0000 "application " | TOPICAL_OINTMENT | Freq: Two times a day (BID) | CUTANEOUS | Status: DC
  Filled 2020-05-05: qty 0.9

## 2020-05-05 MED ORDER — KETOROLAC TROMETHAMINE 30 MG/ML IJ SOLN
30.0000 mg | Freq: Once | INTRAMUSCULAR | Status: AC
  Administered 2020-05-05: 30 mg via INTRAVENOUS
  Filled 2020-05-05: qty 1

## 2020-05-05 MED ORDER — LIDOCAINE-EPINEPHRINE 1 %-1:100000 IJ SOLN
10.0000 mL | Freq: Once | INTRAMUSCULAR | Status: DC
  Filled 2020-05-05: qty 1

## 2020-05-05 MED ORDER — IOHEXOL 300 MG/ML  SOLN
75.0000 mL | Freq: Once | INTRAMUSCULAR | Status: AC | PRN
  Administered 2020-05-05: 75 mL via INTRAVENOUS
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
## 2020-05-05 NOTE — ED Notes (Signed)
Suture cart and suture tray at bedside 
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
## 2020-05-18 ENCOUNTER — Ambulatory Visit
Admission: EM | Admit: 2020-05-18 | Discharge: 2020-05-18 | Disposition: A | Discharge status: Home / Self Care | Payer: Federal, State, Local not specified - PPO

## 2020-05-18 NOTE — ED Triage Notes (Signed)
Pt here for staple removal, 4 staples removed, skin is approximated well

## 2020-06-06 ENCOUNTER — Other Ambulatory Visit: Payer: Self-pay

## 2020-06-06 ENCOUNTER — Encounter: Payer: Self-pay | Admitting: Nurse Practitioner

## 2020-06-06 ENCOUNTER — Ambulatory Visit: Payer: Federal, State, Local not specified - PPO | Attending: Nurse Practitioner | Admitting: Nurse Practitioner

## 2020-06-06 VITALS — Ht 71.0 in | Wt 250.0 lb

## 2020-06-06 DIAGNOSIS — R7309 Other abnormal glucose: Secondary | ICD-10-CM | POA: Diagnosis not present

## 2020-06-06 DIAGNOSIS — Z9189 Other specified personal risk factors, not elsewhere classified: Secondary | ICD-10-CM

## 2020-06-06 DIAGNOSIS — J4520 Mild intermittent asthma, uncomplicated: Secondary | ICD-10-CM

## 2020-06-06 DIAGNOSIS — Z7689 Persons encountering health services in other specified circumstances: Secondary | ICD-10-CM

## 2020-06-06 DIAGNOSIS — J302 Other seasonal allergic rhinitis: Secondary | ICD-10-CM

## 2020-06-06 DIAGNOSIS — Z889 Allergy status to unspecified drugs, medicaments and biological substances status: Secondary | ICD-10-CM

## 2020-06-06 DIAGNOSIS — Z130 Encounter for screening for diseases of the blood and blood-forming organs and certain disorders involving the immune mechanism: Secondary | ICD-10-CM | POA: Diagnosis not present

## 2020-06-06 MED ORDER — CETIRIZINE HCL 10 MG PO TABS: 10.0000 mg | ORAL_TABLET | Freq: Every day | ORAL | 3 refills | Status: DC

## 2020-06-06 MED ORDER — ALBUTEROL SULFATE HFA 108 (90 BASE) MCG/ACT IN AERS: 2.0000 | INHALATION_SPRAY | Freq: Four times a day (QID) | RESPIRATORY_TRACT | 2 refills | Status: DC | PRN

## 2020-06-06 MED ORDER — EPINEPHRINE 0.3 MG/0.3ML IJ SOAJ: 0.3000 mg | INTRAMUSCULAR | 0 refills | Status: DC | PRN
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
## 2020-06-06 NOTE — Addendum Note (Signed)
Addended byMemory Dance on: 06/06/2020 03:51 PM   Modules accepted: Orders

## 2020-06-07 LAB — CBC
Hematocrit: 45.5 % (ref 37.5–51.0)
Hemoglobin: 14.5 g/dL (ref 13.0–17.7)
MCH: 27.2 pg (ref 26.6–33.0)
MCHC: 31.9 g/dL (ref 31.5–35.7)
MCV: 85 fL (ref 79–97)
Platelets: 228 10*3/uL (ref 150–450)
RBC: 5.34 x10E6/uL (ref 4.14–5.80)
RDW: 12.8 % (ref 11.6–15.4)
WBC: 6.1 10*3/uL (ref 3.4–10.8)

## 2020-06-07 LAB — CMP14+EGFR
ALT: 35 IU/L (ref 0–44)
AST: 30 IU/L (ref 0–40)
Albumin/Globulin Ratio: 1.9 (ref 1.2–2.2)
Albumin: 5 g/dL (ref 4.1–5.2)
Alkaline Phosphatase: 91 IU/L (ref 44–121)
BUN/Creatinine Ratio: 7 — ABNORMAL LOW (ref 9–20)
BUN: 7 mg/dL (ref 6–20)
Bilirubin Total: 0.5 mg/dL (ref 0.0–1.2)
CO2: 27 mmol/L (ref 20–29)
Calcium: 10.2 mg/dL (ref 8.7–10.2)
Chloride: 104 mmol/L (ref 96–106)
Creatinine, Ser: 1.04 mg/dL (ref 0.76–1.27)
GFR calc Af Amer: 115 mL/min/{1.73_m2} (ref >59)
GFR calc non Af Amer: 99 mL/min/{1.73_m2} (ref >59)
Globulin, Total: 2.6 g/dL (ref 1.5–4.5)
Glucose: 72 mg/dL (ref 65–99)
Potassium: 3.8 mmol/L (ref 3.5–5.2)
Sodium: 143 mmol/L (ref 134–144)
Total Protein: 7.6 g/dL (ref 6.0–8.5)

## 2020-06-07 LAB — HEMOGLOBIN A1C
Est. average glucose Bld gHb Est-mCnc: 123 mg/dL
Hgb A1c MFr Bld: 5.9 % — ABNORMAL HIGH (ref 4.8–5.6)

## 2020-07-03 ENCOUNTER — Telehealth: Payer: Federal, State, Local not specified - PPO | Admitting: Physician Assistant

## 2020-07-03 DIAGNOSIS — J4520 Mild intermittent asthma, uncomplicated: Secondary | ICD-10-CM

## 2020-07-03 DIAGNOSIS — J45901 Unspecified asthma with (acute) exacerbation: Secondary | ICD-10-CM

## 2020-07-03 MED ORDER — PREDNISONE 20 MG PO TABS: 40.0000 mg | ORAL_TABLET | Freq: Every day | ORAL | 0 refills | Status: DC

## 2020-07-03 MED ORDER — AEROCHAMBER PLUS FLO-VU MEDIUM MISC: 1.0000 | Freq: Once | 0 refills | Status: AC

## 2020-07-03 MED ORDER — FLUTICASONE PROPIONATE 50 MCG/ACT NA SUSP: 2.0000 | Freq: Every day | NASAL | 0 refills | Status: DC

## 2020-07-03 MED ORDER — ALBUTEROL SULFATE HFA 108 (90 BASE) MCG/ACT IN AERS: 2.0000 | INHALATION_SPRAY | Freq: Four times a day (QID) | RESPIRATORY_TRACT | 0 refills | Status: DC | PRN

## 2020-07-03 NOTE — Progress Notes (Signed)
Visit for Asthma  Based on what you have shared with me, it looks like you may have a flare up of your asthma.  Asthma is a chronic (ongoing) lung disease which results in airway obstruction, inflammation and hyper-responsiveness.   Asthma symptoms vary from person to person, with common symptoms including nighttime awakening and decreased ability to participate in normal activities as a result of shortness of breath. It is often triggered by changes in weather, changes in the season, changes in air temperature, or inside (home, school, daycare or work) allergens such as animal dander, mold, mildew, woodstoves or cockroaches.   It can also be triggered by hormonal changes, extreme emotion, physical exertion or an upper respiratory tract illness.     It is important to identify the trigger, and then eliminate or avoid the trigger if possible.   If you have been prescribed medications to be taken on a regular basis, it is important to follow the asthma action plan and to follow guidelines to adjust medication in response to increasing symptoms of decreased peak expiratory flow rate  Treatment: I have prescribed: Albuterol (Proventil HFA; Ventolin HFA) 108 (90 Base) MCG/ACT Inhaler 2 puffs into the lungs every six hours as needed for wheezing or shortness of breath and Prednisone 40mg  by mouth per day for 5 - 7 days along with Flonase.  Please keep taking your zyrtec every day.  HOME CARE . Only take medications as instructed by your medical team. . Consider wearing a mask or scarf to improve breathing air temperature have been shown to decrease irritation and decrease exacerbations . Get rest. . Taking a steamy shower or using a humidifier may help nasal congestion sand ease sore throat pain. You can place a towel over your head and breathe in the steam from hot water coming from a  faucet. . Using a saline nasal spray works much the same way.  . Cough drops, hare candies and sore throat lozenges may ease your cough.  . Avoid close contacts especially the very you and the elderly . Cover your mouth if you cough or sneeze . Always remember to wash your hands.    GET HELP RIGHT AWAY IF: . You develop worsening symptoms; breathlessness at rest, drowsy, confused or agitated, unable to speak in full sentences . You have coughing fits . You develop a severe headache or visual changes . You develop shortness of breath, difficulty breathing or start having chest pain . Your symptoms persist after you have completed your treatment plan . If your symptoms do not improve within 10 days  MAKE SURE YOU . Understand these instructions. . Will watch your condition. . Will get help right away if you are not doing well or get worse.   Your e-visit answers were reviewed by a board certified advanced clinical practitioner to complete your personal care plan, Depending upon the condition, your plan could have included both over the counter or prescription medications.  Please review your pharmacy choice. Your safety is important to . If you have drug allergies check your prescription carefully. You can use MyChart to ask questions about today's visit, request a non-urgent call back, or ask for a work or school excuse for 24 hours related to this e-Visit. If it has been greater than 24 hours you will need to follow up with your provider, or enter a new e-Visit to address those concerns.  You will get an e-mail in the next two days asking about your experience. I hope  that your e-visit has been valuable and will speed your recovery. Thank you for using e-visits.  Greater than 5 minutes, yet less than 10 minutes of time have been spent researching, coordinating, and implementing care for this patient today

## 2020-07-13 ENCOUNTER — Other Ambulatory Visit: Payer: Self-pay | Admitting: Nurse Practitioner

## 2020-07-13 ENCOUNTER — Telehealth: Payer: Self-pay | Admitting: Nurse Practitioner

## 2020-07-13 MED ORDER — CETIRIZINE HCL 5 MG/5ML PO SOLN: 10.0000 mg | Freq: Every day | ORAL | 6 refills | Status: DC

## 2020-07-13 MED ORDER — ONDANSETRON HCL 4 MG PO TABS: 4.0000 mg | ORAL_TABLET | Freq: Three times a day (TID) | ORAL | 1 refills | Status: DC | PRN

## 2020-07-13 NOTE — Telephone Encounter (Signed)
I do not prescribe phenergan. What was she taking it for? I will fill a substitution depending on what she was using phenergan for. Also I will send her zyrtec liquid however her insurance may not pay for liquid as she does not have a history of swallowing disorder. Thanks

## 2020-07-13 NOTE — Telephone Encounter (Signed)
I will send zofran. I don't prescribe phenergan

## 2020-07-13 NOTE — Telephone Encounter (Signed)
Patient states cetirizine (ZYRTEC) 10 MG tablet should be in liquid form not tablet, patient would like a new Rx sent to the new pharmacy mentioned below. Also patient forgot to mention,  Phenergan rx, to PCP. Please add to medication list and requesting a refill  [id number] - [place], [place] - [address] AT [facility]

## 2020-07-13 NOTE — Telephone Encounter (Signed)
Spoke to patient. Patient is aware that his Insurance may not pay for the liquid form of Zyrtec and he may pay out of pocket.  Pt. Stated he was taking Phenergan for nauseous and seasonal allergies.

## 2020-07-13 NOTE — Telephone Encounter (Signed)
Please see request below.  Cetirizine tablet as last filled on 06/06/20 #90 with 3 refills sent to a [facility]. Patient requesting a new prescription for the liquid form of the medication [facility] [place]. Patient also requesting a prescription for Phenergan which is not on current med list.

## 2020-07-14 NOTE — Telephone Encounter (Signed)
Attempt to call patient to inform on Rx. No answer and LVM.

## 2020-07-17 ENCOUNTER — Encounter: Payer: Federal, State, Local not specified - PPO | Admitting: Nurse Practitioner

## 2020-07-19 ENCOUNTER — Other Ambulatory Visit: Payer: Self-pay

## 2020-07-19 ENCOUNTER — Encounter: Payer: Self-pay | Admitting: Nurse Practitioner

## 2020-07-19 ENCOUNTER — Ambulatory Visit: Payer: Federal, State, Local not specified - PPO | Attending: Nurse Practitioner | Admitting: Nurse Practitioner

## 2020-07-19 VITALS — BP 117/77 | HR 92 | Temp 99.0°F | Ht 72.0 in | Wt 266.0 lb

## 2020-07-19 DIAGNOSIS — R7303 Prediabetes: Secondary | ICD-10-CM | POA: Diagnosis not present

## 2020-07-19 DIAGNOSIS — Z0000 Encounter for general adult medical examination without abnormal findings: Secondary | ICD-10-CM | POA: Diagnosis not present

## 2020-07-19 DIAGNOSIS — G8929 Other chronic pain: Secondary | ICD-10-CM | POA: Diagnosis not present

## 2020-07-19 DIAGNOSIS — M25562 Pain in left knee: Secondary | ICD-10-CM | POA: Diagnosis not present

## 2020-07-19 LAB — GLUCOSE, POCT (MANUAL RESULT ENTRY): POC Glucose: 102 mg/dl — AB (ref 70–99)

## 2020-07-19 NOTE — Progress Notes (Signed)
Assessment & Plan:  Phillip Leach was seen today for annual exam.  Diagnoses and all orders for this visit:  Encounter for annual physical exam  Prediabetes -     Glucose (CBG)  Chronic pain of left knee -     DG Knee Complete 4 Views Left; Future    Patient has been counseled on age-appropriate routine health concerns for screening and prevention. These are reviewed and up-to-date. Referrals have been placed accordingly. Immunizations are up-to-date or declined.    Subjective:   Chief Complaint  Patient presents with  . Annual Exam    Pt. Is here for a physical.    [name] 24 y.o. male presents to office today for annual physical. He has a history of ADHD, Anxiety, Asthma, Seasonal allergies,  Bipolar 1 D/O, Depression, Eczema and GERD   He endorses chronic generalized pain and left knee pain. Associated symptoms: Left knee weakness, swelling and a knot under the left knee cap which is not palpable today based on my exam.   He has a myriad of concerns including bilateral hand weakness and numbness (5/5 strength test today), generalized lymphadenopathy (there were no palpable lymph nodes on exam) and intermittent limping due to leg pain. Exam was benign today. .  Review of Systems  Constitutional: Negative for fever, malaise/fatigue and weight loss.  HENT: Negative.  Negative for nosebleeds.   Eyes: Negative.  Negative for blurred vision, double vision and photophobia.  Respiratory: Negative.  Negative for cough and shortness of breath.   Cardiovascular: Negative.  Negative for chest pain, palpitations and leg swelling.  Gastrointestinal: Negative.  Negative for heartburn, nausea and vomiting.  Genitourinary: Negative.   Musculoskeletal: Positive for joint pain and myalgias.  Skin: Negative.   Neurological: Positive for weakness. Negative for dizziness, focal weakness, seizures and headaches.  Endo/Heme/Allergies: Negative.   Psychiatric/Behavioral: Negative.   Negative for suicidal ideas.    Past Medical History:  Diagnosis Date  . ADHD (attention deficit hyperactivity disorder)   . Anxiety   . Asthma   . Bipolar 1 disorder ([place])   . Depression   . Eczema   . GERD (gastroesophageal reflux disease)   . Seasonal allergies     Past Surgical History:  Procedure Laterality Date  . ADENOIDECTOMY    . APPENDECTOMY    . LAPAROSCOPIC APPENDECTOMY N/A 08/19/2013   Procedure: APPENDECTOMY LAPAROSCOPIC;  Surgeon: Harl Bowie, MD;  Location: [facility];  Service: General;  Laterality: N/A;  . TONSILLECTOMY      Family History  Problem Relation Age of Onset  . Cancer Paternal Grandmother        ovarian    Social History Reviewed with no changes to be made today.   Outpatient Medications Prior to Visit  Medication Sig Dispense Refill  . albuterol (VENTOLIN HFA) 108 (90 Base) MCG/ACT inhaler Inhale 2 puffs into the lungs every 6 (six) hours as needed for wheezing or shortness of breath. 8 g 0  . cetirizine HCl (ZYRTEC CHILDRENS ALLERGY) 5 MG/5ML SOLN Take 10 mLs (10 mg total) by mouth daily. 473 mL 6  . EPINEPHrine (EPIPEN 2-PAK) 0.3 mg/0.3 mL IJ SOAJ injection Inject 0.3 mg into the muscle as needed for anaphylaxis. 1 each 0  . fluticasone (FLONASE) 50 MCG/ACT nasal spray Place 2 sprays into both nostrils daily. 9.9 g 0  . ondansetron (ZOFRAN) 4 MG tablet Take 1 tablet (4 mg total) by mouth every 8 (eight) hours as needed for nausea or vomiting.  30 tablet 1  . predniSONE (DELTASONE) 20 MG tablet Take 2 tablets (40 mg total) by mouth daily. 10 tablet 0   No facility-administered medications prior to visit.    Allergies  Allergen Reactions  . Minocin [Minocycline Hcl] Other (See Comments)    Unknown   . Molds & Smuts   . Shellfish Allergy Other (See Comments)    Unknown   . Amoxicillin-Pot Clavulanate Rash  . Penicillins Rash       Objective:    BP 117/77 (BP Location: Left Arm, Patient Position: Sitting, Cuff Size: Large)    Pulse 92   Temp 99 F (37.2 C) (Oral)   Ht 6' (1.829 m)   Wt 266 lb (120.7 kg)   SpO2 100%   BMI 36.08 kg/m  Wt Readings from Last 3 Encounters:  07/19/20 266 lb (120.7 kg)  06/06/20 250 lb (113.4 kg)  05/05/20 240 lb (108.9 kg)    Physical Exam Constitutional:      Appearance: He is well-developed and well-nourished.  HENT:     Head: Normocephalic and atraumatic.     Right Ear: Hearing, tympanic membrane, ear canal and external ear normal.     Left Ear: Hearing, tympanic membrane, ear canal and external ear normal.     Nose: Nose normal. No mucosal edema or rhinorrhea.     Mouth/Throat:     Mouth: Oropharynx is clear and moist and mucous membranes are normal.     Dentition: Normal dentition.     Pharynx: Uvula midline.     Tonsils: No tonsillar exudate. 1+ on the right. 1+ on the left.  Eyes:     General: Lids are normal. No scleral icterus.    Extraocular Movements: EOM normal.     Conjunctiva/sclera: Conjunctivae normal.     Pupils: Pupils are equal, round, and reactive to light.  Neck:     Thyroid: No thyromegaly.     Trachea: No tracheal deviation.  Cardiovascular:     Rate and Rhythm: Normal rate and regular rhythm.     Pulses: Intact distal pulses.     Heart sounds: Normal heart sounds. No murmur heard. No friction rub. No gallop.   Pulmonary:     Effort: Pulmonary effort is normal. No respiratory distress.     Breath sounds: Normal breath sounds. No wheezing or rales.  Chest:     Chest wall: No mass or tenderness.  Breasts:     Right: No inverted nipple, mass, nipple discharge, skin change or tenderness.     Left: No inverted nipple, mass, nipple discharge, skin change or tenderness.    Abdominal:     General: Bowel sounds are normal. There is no distension.     Palpations: Abdomen is soft. There is no mass.     Tenderness: There is no abdominal tenderness. There is no guarding or rebound.     Hernia: There is no hernia in the right inguinal area or left  inguinal area.  Musculoskeletal:        General: No tenderness, deformity or edema. Normal range of motion.     Cervical back: Normal range of motion and neck supple.     Right lower leg: No swelling, deformity, lacerations, tenderness or bony tenderness. No edema.     Left lower leg: No swelling, deformity, lacerations, tenderness or bony tenderness. No edema.       Legs:  Lymphadenopathy:     Head:     Right side of head: No submental,  submandibular, tonsillar, preauricular, posterior auricular or occipital adenopathy.     Left side of head: No submental, submandibular, tonsillar, preauricular, posterior auricular or occipital adenopathy.     Cervical: No cervical adenopathy.     Right cervical: No superficial, deep or posterior cervical adenopathy.    Left cervical: No superficial, deep or posterior cervical adenopathy.     Lower Body: No right inguinal and no right inguinal adenopathy. No left inguinal and no left inguinal adenopathy.  Skin:    General: Skin is warm and dry.     Capillary Refill: Capillary refill takes less than 2 seconds.     Findings: No erythema.  Neurological:     Mental Status: He is alert and oriented to person, place, and time.     Cranial Nerves: No cranial nerve deficit.     Motor: No abnormal muscle tone.     Coordination: Coordination normal.     Deep Tendon Reflexes: Reflexes normal.  Psychiatric:        Mood and Affect: Mood and affect normal.        Behavior: Behavior normal.        Thought Content: Thought content normal.        Judgment: Judgment normal.          Patient has been counseled extensively about nutrition and exercise as well as the importance of adherence with medications and regular follow-up. The patient was given clear instructions to go to ER or return to medical center if symptoms don't improve, worsen or new problems develop. The patient verbalized understanding.   Follow-up: Return if symptoms worsen or fail to improve.    Gildardo Pounds, FNP-BC [facility] [place], [place]   07/19/2020, 11:35 PM

## 2020-11-04 IMAGING — CT CT ANGIO CHEST
3 of 9 series · 18 of 46 positions shown · IV contrast (omnipaque)
Comparison: None.

CLINICAL DATA: Complex chest pain with intermediate/high
probability of ACS

EXAM:
CT ANGIOGRAPHY CHEST WITH CONTRAST
TECHNIQUE: Multidetector CT imaging of the chest was performed using the
standard protocol during bolus administration of intravenous
contrast. Multiplanar CT image reconstructions and MIPs were
obtained to evaluate the vascular anatomy.
CONTRAST:  100mL OMNIPAQUE IOHEXOL 350 MG/ML SOLN

[Series 6: axial arterial · axial · arterial · 0.70mm/px · z∈[-360,-96]mm · 12 of 105 slices shown]
[im 9/105  lung]
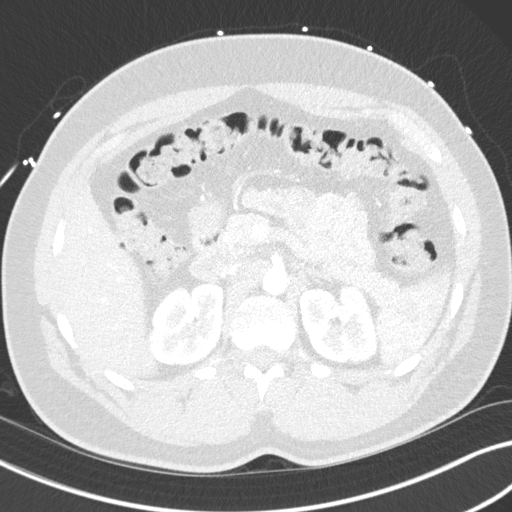
[im 17/105  soft-tissue]
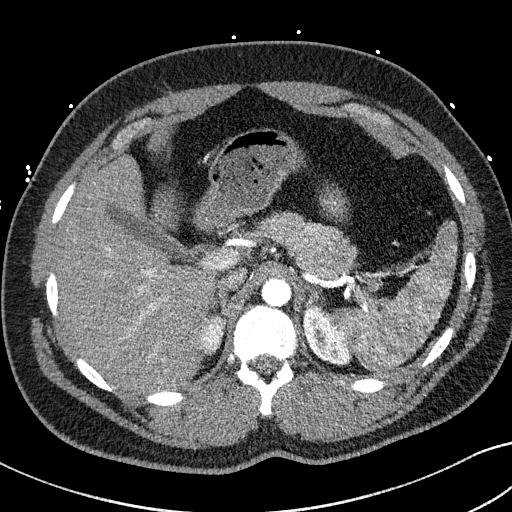
[im 25/105  lung]
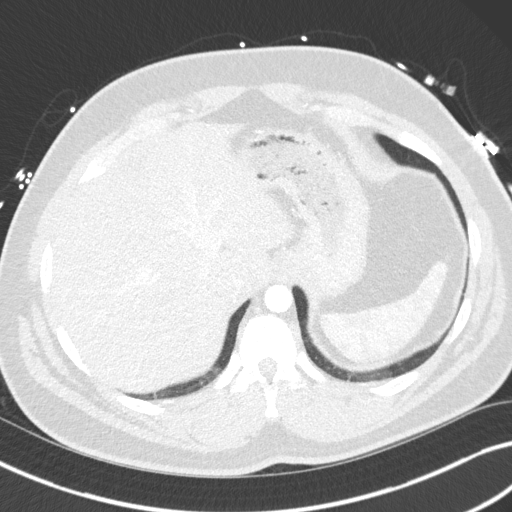
[im 33/105  soft-tissue]
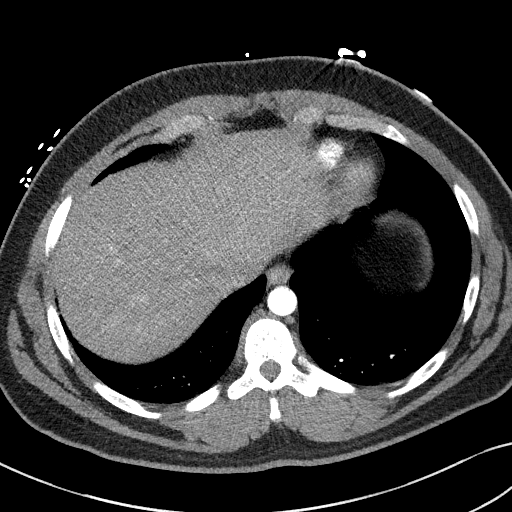
[im 41/105  lung]
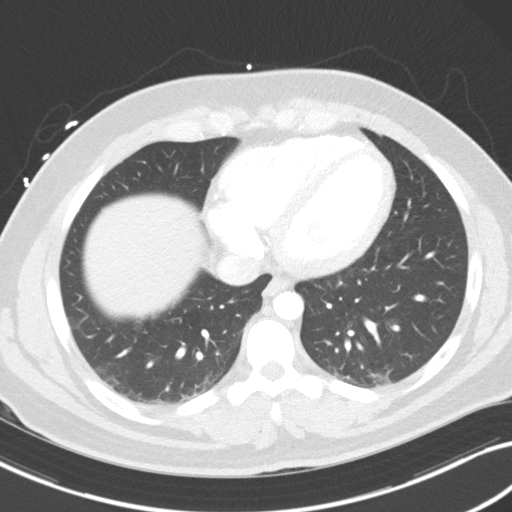
[im 49/105  soft-tissue]
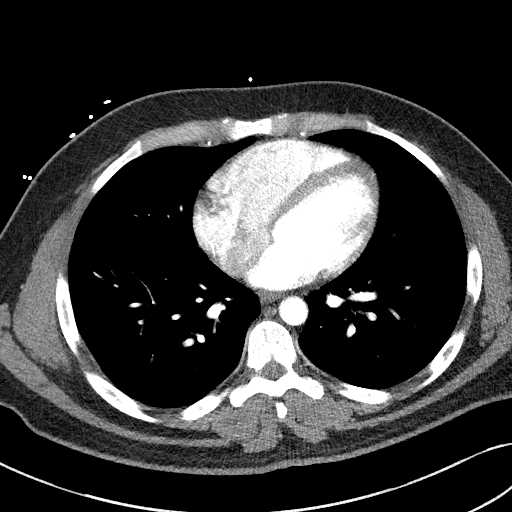
[im 57/105  lung]
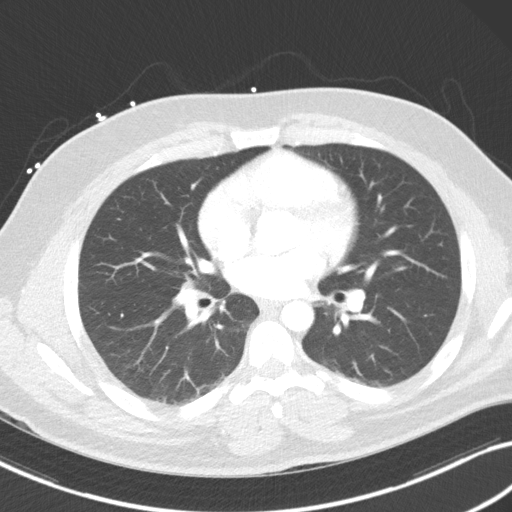
[im 65/105  soft-tissue]
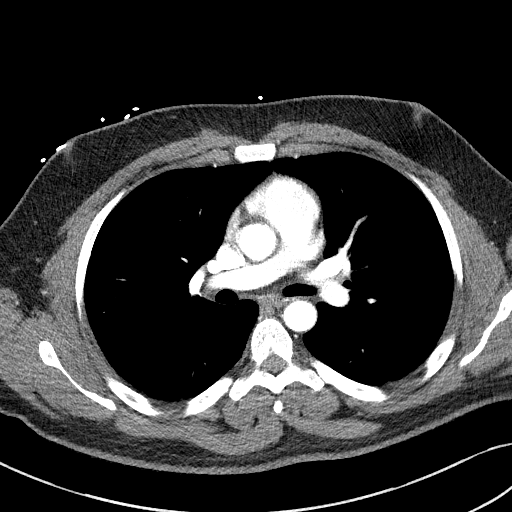
[im 73/105  lung]
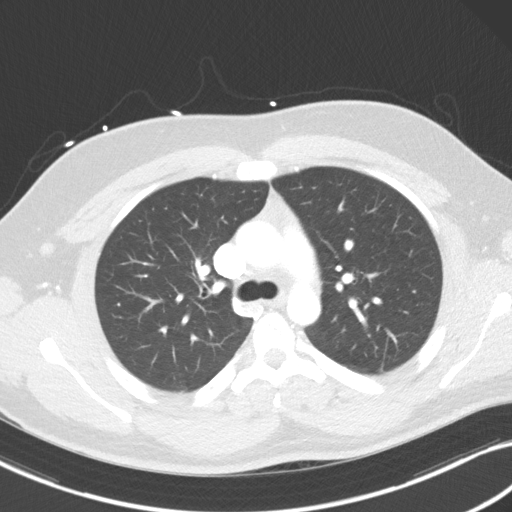
[im 81/105  soft-tissue]
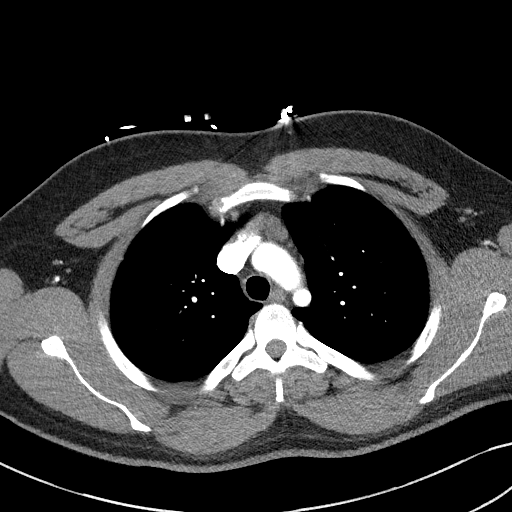
[im 89/105  lung]
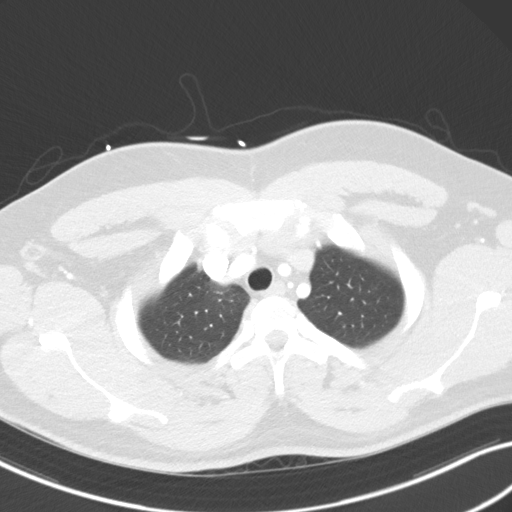
[im 97/105  soft-tissue]
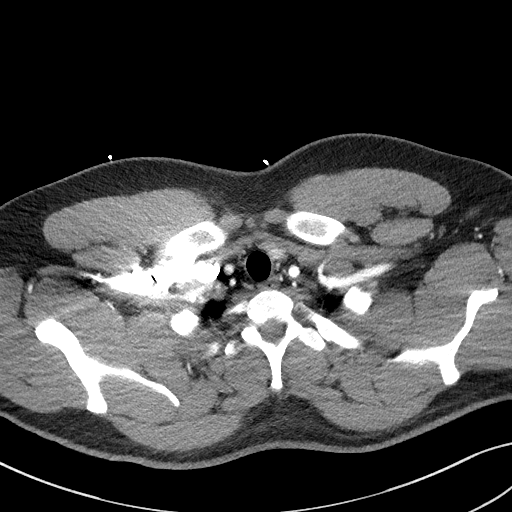

[Series 7: lung · axial · 0.70mm/px · z∈[-297,-222]mm · 3 of 53 slices shown]
[im 8/53  soft-tissue]
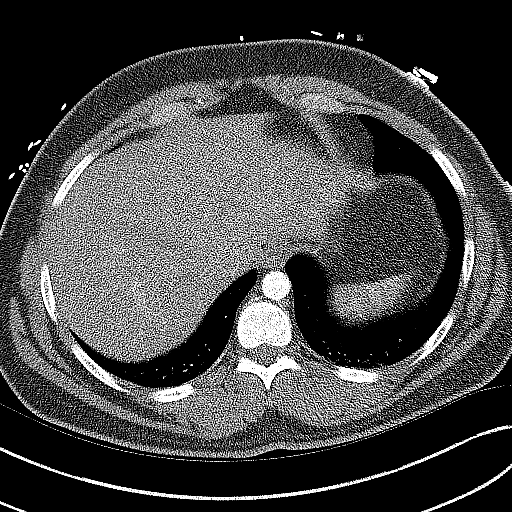
[im 15/53  soft-tissue]
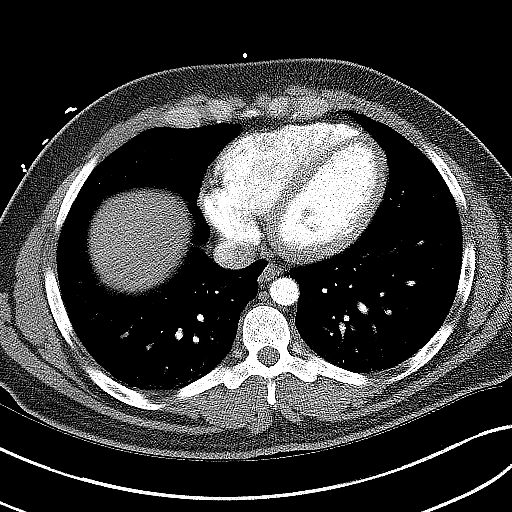
[im 23/53  soft-tissue]
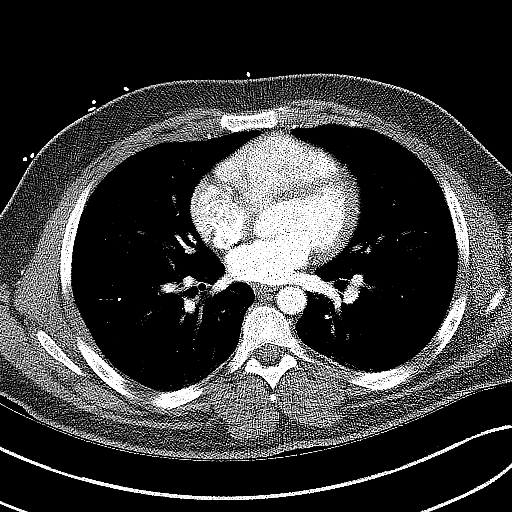

[Series 9: coronals · coronal · 0.62mm/px · 3 of 130 slices shown]
[im 33/130  soft-tissue]
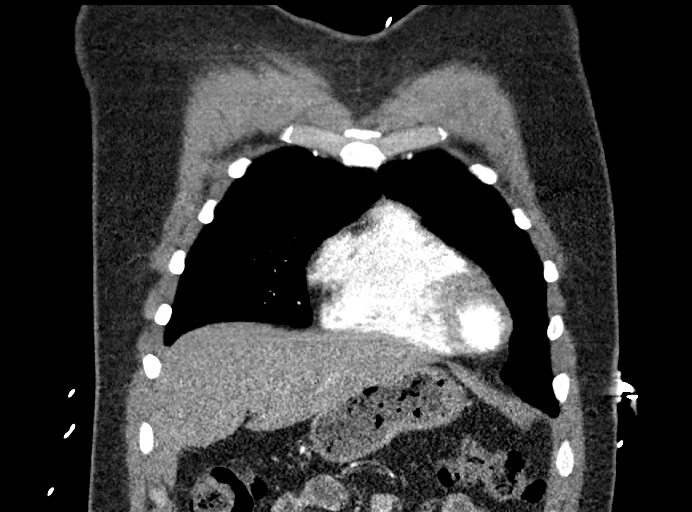
[im 65/130  soft-tissue]
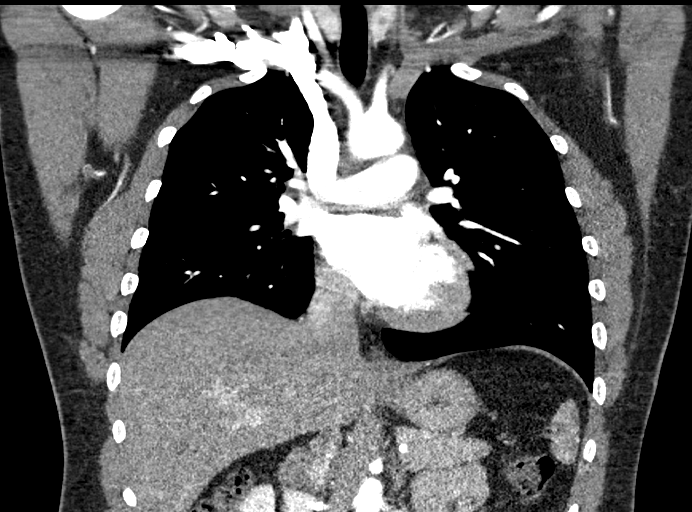
[im 97/130  soft-tissue]
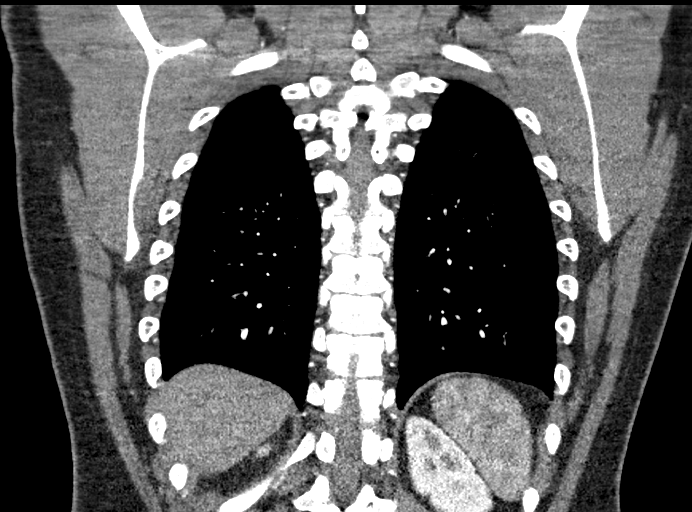

[18 of 46 positions shown; findings below may reference images not displayed]

FINDINGS: Cardiovascular: Noncontrast phase shows no intramural aortic
hematoma. Postcontrast imaging shows normal aorta with 4 vessel arch
branching. There is no aortic aneurysm or dissection. Left and right
coronary arteries are enhancing. Opacified segments of the pulmonary
arterial tree shows no filling defects. Normal heart size. No
pericardial effusion

Mediastinum/Nodes: Negative for adenopathy or pneumomediastinum. Age
normal thymus.

Lungs/Pleura: Minimal dependent atelectasis. Subpleural right middle
lobe nodule has the appearance of lymph node. There is no edema,
consolidation, effusion, or pneumothorax.

Upper Abdomen: Relatively low-density liver is likely from contrast
timing.

Musculoskeletal: No explanation for chest pain.

Review of the MIP images confirms the above findings.
IMPRESSION: Negative chest CTA.

## 2020-11-13 ENCOUNTER — Encounter (HOSPITAL_COMMUNITY): Payer: Self-pay | Admitting: *Deleted

## 2020-11-13 ENCOUNTER — Other Ambulatory Visit: Payer: Self-pay

## 2020-11-13 ENCOUNTER — Emergency Department (HOSPITAL_COMMUNITY)
Admission: EM | Admit: 2020-11-13 | Discharge: 2020-11-13 | Disposition: A | Discharge status: Home / Self Care | Payer: Federal, State, Local not specified - PPO | Attending: Emergency Medicine | Admitting: Emergency Medicine

## 2020-11-13 DIAGNOSIS — T23209A Burn of second degree of unspecified hand, unspecified site, initial encounter: Secondary | ICD-10-CM

## 2020-11-13 DIAGNOSIS — L249 Irritant contact dermatitis, unspecified cause: Secondary | ICD-10-CM | POA: Insufficient documentation

## 2020-11-13 DIAGNOSIS — X088XXA Exposure to other specified smoke, fire and flames, initial encounter: Secondary | ICD-10-CM | POA: Insufficient documentation

## 2020-11-13 DIAGNOSIS — J45909 Unspecified asthma, uncomplicated: Secondary | ICD-10-CM | POA: Diagnosis not present

## 2020-11-13 DIAGNOSIS — Z87891 Personal history of nicotine dependence: Secondary | ICD-10-CM | POA: Diagnosis not present

## 2020-11-13 DIAGNOSIS — T23231A Burn of second degree of multiple right fingers (nail), not including thumb, initial encounter: Secondary | ICD-10-CM | POA: Insufficient documentation

## 2020-11-13 DIAGNOSIS — T23232A Burn of second degree of multiple left fingers (nail), not including thumb, initial encounter: Secondary | ICD-10-CM | POA: Insufficient documentation

## 2020-11-13 DIAGNOSIS — Z7951 Long term (current) use of inhaled steroids: Secondary | ICD-10-CM | POA: Insufficient documentation

## 2020-11-13 MED ORDER — BACITRACIN ZINC 500 UNIT/GM EX OINT: 1.0000 "application " | TOPICAL_OINTMENT | Freq: Two times a day (BID) | CUTANEOUS | 0 refills | Status: DC

## 2020-11-13 MED ORDER — HYDROCORTISONE 1 % EX CREA: TOPICAL_CREAM | CUTANEOUS | 0 refills | Status: DC

## 2020-11-13 NOTE — ED Notes (Signed)
Pt left prior to receiving discharge paperwork  
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
## 2020-11-13 NOTE — ED Triage Notes (Signed)
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

## 2020-11-21 ENCOUNTER — Ambulatory Visit
Admission: EM | Admit: 2020-11-21 | Discharge: 2020-11-21 | Discharge status: Absent without leave | Payer: Federal, State, Local not specified - PPO

## 2020-11-28 ENCOUNTER — Telehealth: Payer: Self-pay

## 2020-11-28 NOTE — Telephone Encounter (Signed)
Copied from CRM [id number]. Topic: General - Other >> Nov 21, 2020  4:36 PM Gwenlyn Fudge wrote: Reason for CRM: Pt called and is requesting to be added to the waitlist in case an appt comes up sooner. Pt is also requesting to speak with PCP regarding setting up a larger dose of the allergy medication, cetrizine. Pt states that he is still experiencing lots of allergy symptoms. Please advise.

## 2020-11-28 NOTE — Telephone Encounter (Signed)
Will forward to provider  

## 2020-11-29 ENCOUNTER — Encounter: Payer: Self-pay | Admitting: Nurse Practitioner

## 2020-11-29 ENCOUNTER — Other Ambulatory Visit: Payer: Self-pay | Admitting: Nurse Practitioner

## 2020-11-29 MED ORDER — CETIRIZINE HCL 10 MG PO CHEW: 10.0000 mg | CHEWABLE_TABLET | Freq: Every day | ORAL | 1 refills | Status: DC

## 2020-11-29 MED ORDER — MONTELUKAST SODIUM 10 MG PO TABS
10.0000 mg | ORAL_TABLET | Freq: Every day | ORAL | 1 refills | Status: DC
Start: 2020-11-29 — End: 2020-12-06

## 2020-11-29 NOTE — Telephone Encounter (Signed)
Zyrtec chewable has been sent as well as singulair for nighttime use. He was also sent a mychart message regarding this
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
## 2020-12-06 ENCOUNTER — Telehealth: Payer: Self-pay | Admitting: Nurse Practitioner

## 2020-12-06 ENCOUNTER — Encounter: Payer: Self-pay | Admitting: Critical Care Medicine

## 2020-12-06 ENCOUNTER — Ambulatory Visit: Payer: Federal, State, Local not specified - PPO | Attending: Critical Care Medicine | Admitting: Critical Care Medicine

## 2020-12-06 ENCOUNTER — Other Ambulatory Visit: Payer: Self-pay

## 2020-12-06 VITALS — BP 135/88 | HR 75 | Resp 16 | Ht 72.0 in | Wt 260.0 lb

## 2020-12-06 DIAGNOSIS — J3089 Other allergic rhinitis: Secondary | ICD-10-CM | POA: Diagnosis not present

## 2020-12-06 DIAGNOSIS — T300 Burn of unspecified body region, unspecified degree: Secondary | ICD-10-CM | POA: Diagnosis not present

## 2020-12-06 DIAGNOSIS — R7303 Prediabetes: Secondary | ICD-10-CM

## 2020-12-06 DIAGNOSIS — J309 Allergic rhinitis, unspecified: Secondary | ICD-10-CM | POA: Insufficient documentation

## 2020-12-06 MED ORDER — PROMETHAZINE HCL 25 MG PO TABS: 25.0000 mg | ORAL_TABLET | Freq: Three times a day (TID) | ORAL | 0 refills | Status: DC | PRN

## 2020-12-06 NOTE — Telephone Encounter (Signed)
Pt saw dr Delford Field today and he forgot to tell md that he must have liquid medication only not pill etc. Please sent in liquid phenergan to [facility] [address] in [place] phone number [phone]

## 2020-12-06 NOTE — Telephone Encounter (Signed)
Will route to doctor.

## 2020-12-06 NOTE — Assessment & Plan Note (Signed)
History of prediabetes A1c of 5.9 we will recheck metabolic profile and A1c at this visit
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
## 2020-12-06 NOTE — Assessment & Plan Note (Signed)
Allergic rhinitis to mold pollen weeds  Patient given a prescription for promethazine which is worked well in the past to take as needed

## 2020-12-06 NOTE — Patient Instructions (Signed)
He may continue to work in [employer] but stay away from hot surfaces  I sent Phenergan promethazine as needed to your pharmacy in [facility] today include hemoglobin A1c metabolic panel  Please obtain a good skin moisturizer for both body and hands I recommend either Aveeno, Cerave, Burts bees, Cetaphil, Aquaphor    moisturize both your hands and your forearms after any shower and in the mornings  A work note was issued indicating the importance to stay away from hot thermal areas for at least the next 8 weeks and you may also want to give consideration to a different job position  Keep follow-up appointments with Ms. Meredeth Ide
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
## 2020-12-07 ENCOUNTER — Telehealth: Payer: Self-pay | Admitting: Nurse Practitioner

## 2020-12-07 LAB — COMPREHENSIVE METABOLIC PANEL
ALT: 31 IU/L (ref 0–44)
AST: 24 IU/L (ref 0–40)
Albumin/Globulin Ratio: 2 (ref 1.2–2.2)
Albumin: 4.5 g/dL (ref 4.1–5.2)
Alkaline Phosphatase: 86 IU/L (ref 44–121)
BUN/Creatinine Ratio: 9 (ref 9–20)
BUN: 10 mg/dL (ref 6–20)
Bilirubin Total: 0.3 mg/dL (ref 0.0–1.2)
CO2: 23 mmol/L (ref 20–29)
Calcium: 9.6 mg/dL (ref 8.7–10.2)
Chloride: 103 mmol/L (ref 96–106)
Creatinine, Ser: 1.14 mg/dL (ref 0.76–1.27)
Globulin, Total: 2.2 g/dL (ref 1.5–4.5)
Glucose: 93 mg/dL (ref 65–99)
Potassium: 4.2 mmol/L (ref 3.5–5.2)
Sodium: 140 mmol/L (ref 134–144)
Total Protein: 6.7 g/dL (ref 6.0–8.5)
eGFR: 92 mL/min/{1.73_m2} (ref >59)

## 2020-12-07 LAB — HEMOGLOBIN A1C
Est. average glucose Bld gHb Est-mCnc: 120 mg/dL
Hgb A1c MFr Bld: 5.8 % — ABNORMAL HIGH (ref 4.8–5.6)

## 2020-12-07 MED ORDER — PROMETHAZINE HCL 6.25 MG/5ML PO SYRP: 25.0000 mg | ORAL_SOLUTION | Freq: Four times a day (QID) | ORAL | 3 refills | Status: DC | PRN

## 2020-12-07 NOTE — Telephone Encounter (Signed)
FYI

## 2020-12-07 NOTE — Telephone Encounter (Signed)
Noted  

## 2020-12-07 NOTE — Telephone Encounter (Signed)
Let pt know I sent change to syrup format

## 2020-12-07 NOTE — Telephone Encounter (Signed)
Copied from CRM [id number]. Topic: Appointment Scheduling - Transfer of Care >> Dec 07, 2020  8:55 AM [name] wrote: Pt is requesting to transfer FROM: Phillip Leach  Pt is requesting to transfer TO: Dr Shan Levans Reason for requested transfer: More comfortable with seeing a male provider, and the quality of care received   Send CRM to patient's current PCP (transferring FROM).

## 2020-12-07 NOTE — Telephone Encounter (Signed)
Pt has been called and informed of medication being sent.

## 2020-12-11 NOTE — Telephone Encounter (Signed)
I just saw him one time for a work in issue  I would appreciate his PCP Bertram Denver weighing in on this as I was covering her during this visit

## 2020-12-11 NOTE — Telephone Encounter (Signed)
I agree with Zelda.  For now I would prefer he keep his PCP

## 2020-12-11 NOTE — Telephone Encounter (Signed)
I am not sure what the quality of care issue is. He did not mention being dissatisfied at his last visit and I have only seen him in person once.  He can feel free to address his concerns with me and if Dr. Delford Field would like for him to continue care with me then I am also agreeable as Dr. Delford Field is supposed to be mostly handling serious pulmonary issues. If you could explain this to him Phillip Leach that would be great. He can let me know the concerns he had with the care provided by me through his mychart.

## 2020-12-12 NOTE — Telephone Encounter (Signed)
I have tried to contact the patient to advice him on the decision that was made to transfer care to Dr. Delford Field. I was not able to get him on the phone and left a voicemail advising him to call the office and ask to speak with me to further discuss the matter.

## 2020-12-22 ENCOUNTER — Emergency Department (HOSPITAL_COMMUNITY)
Admission: EM | Admit: 2020-12-22 | Discharge: 2020-12-22 | Disposition: A | Discharge status: Home / Self Care | Payer: Federal, State, Local not specified - PPO | Attending: Emergency Medicine | Admitting: Emergency Medicine

## 2020-12-22 ENCOUNTER — Other Ambulatory Visit: Payer: Self-pay

## 2020-12-22 DIAGNOSIS — B349 Viral infection, unspecified: Secondary | ICD-10-CM | POA: Diagnosis not present

## 2020-12-22 DIAGNOSIS — J45909 Unspecified asthma, uncomplicated: Secondary | ICD-10-CM | POA: Diagnosis not present

## 2020-12-22 DIAGNOSIS — Z87891 Personal history of nicotine dependence: Secondary | ICD-10-CM | POA: Diagnosis not present

## 2020-12-22 DIAGNOSIS — R079 Chest pain, unspecified: Secondary | ICD-10-CM | POA: Insufficient documentation

## 2020-12-22 DIAGNOSIS — Z20822 Contact with and (suspected) exposure to covid-19: Secondary | ICD-10-CM | POA: Diagnosis not present

## 2020-12-22 DIAGNOSIS — R059 Cough, unspecified: Secondary | ICD-10-CM | POA: Diagnosis present

## 2020-12-22 LAB — SARS CORONAVIRUS 2 (TAT 6-24 HRS): SARS Coronavirus 2: NEGATIVE

## 2020-12-22 MED ORDER — PROMETHAZINE-CODEINE 6.25-10 MG/5ML PO SYRP: 5.0000 mL | ORAL_SOLUTION | ORAL | 0 refills | Status: DC | PRN

## 2020-12-22 MED ORDER — ALBUTEROL SULFATE HFA 108 (90 BASE) MCG/ACT IN AERS
2.0000 | INHALATION_SPRAY | RESPIRATORY_TRACT | Status: DC | PRN
  Administered 2020-12-22: 2 via RESPIRATORY_TRACT
  Filled 2020-12-22: qty 6.7

## 2020-12-22 MED ORDER — IBUPROFEN 800 MG PO TABS
800.0000 mg | ORAL_TABLET | Freq: Once | ORAL | Status: AC
  Administered 2020-12-22: 800 mg via ORAL
  Filled 2020-12-22: qty 1
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
## 2020-12-22 NOTE — Discharge Instructions (Signed)
Your COVID test is pending.  Results should be back by tomorrow.  You may review the your results on the MyChart app.  I recommend that you alternate Tylenol (500 mg) and ibuprofen (600 mg) every 4 and 6 hours respectively.  Use the albuterol inhaler 1 to 2 puffs every 4-6 hours as needed.  Follow-up with your primary care provider for recheck or return to the emergency department for any new or worsening symptoms.

## 2020-12-22 NOTE — ED Triage Notes (Signed)
Pt reports generalized body aches and cough.

## 2021-03-05 ENCOUNTER — Other Ambulatory Visit: Payer: Self-pay

## 2021-03-05 ENCOUNTER — Emergency Department (HOSPITAL_COMMUNITY)
Admission: EM | Admit: 2021-03-05 | Discharge: 2021-03-05 | Disposition: A | Discharge status: Home / Self Care | Payer: Federal, State, Local not specified - PPO | Attending: Emergency Medicine | Admitting: Emergency Medicine

## 2021-03-05 DIAGNOSIS — Z87891 Personal history of nicotine dependence: Secondary | ICD-10-CM | POA: Insufficient documentation

## 2021-03-05 DIAGNOSIS — J45909 Unspecified asthma, uncomplicated: Secondary | ICD-10-CM | POA: Diagnosis not present

## 2021-03-05 DIAGNOSIS — R21 Rash and other nonspecific skin eruption: Secondary | ICD-10-CM | POA: Diagnosis present

## 2021-03-05 DIAGNOSIS — L301 Dyshidrosis [pompholyx]: Secondary | ICD-10-CM | POA: Diagnosis not present

## 2021-03-05 MED ORDER — HYDROXYZINE HCL 25 MG PO TABS: 25.0000 mg | ORAL_TABLET | Freq: Four times a day (QID) | ORAL | 0 refills | Status: DC

## 2021-03-05 MED ORDER — TRIAMCINOLONE ACETONIDE 0.1 % EX CREA: 1.0000 "application " | TOPICAL_CREAM | Freq: Three times a day (TID) | CUTANEOUS | 0 refills | Status: DC

## 2021-03-05 MED ORDER — HYDROXYZINE HCL 25 MG PO TABS
25.0000 mg | ORAL_TABLET | Freq: Once | ORAL | Status: AC
  Administered 2021-03-05: 25 mg via ORAL
  Filled 2021-03-05: qty 1

## 2021-03-05 MED ORDER — DEXAMETHASONE SODIUM PHOSPHATE 10 MG/ML IJ SOLN
10.0000 mg | Freq: Once | INTRAMUSCULAR | Status: AC
  Administered 2021-03-05: 10 mg via INTRAMUSCULAR
  Filled 2021-03-05: qty 1
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
## 2021-03-05 NOTE — ED Triage Notes (Signed)
Rash on both hands

## 2021-03-05 NOTE — Discharge Instructions (Signed)
Your rash is most likely a form of eczema.  It is important that you try to keep your hands dry and avoid wearing gloves or keeping her hands wet.  Apply the cream to dry skin.  The hydroxyzine tablets will help with itching but be aware they may cause drowsiness.  Keep your appointment later this week with dermatology.

## 2021-03-20 ENCOUNTER — Other Ambulatory Visit: Payer: Self-pay

## 2021-03-20 ENCOUNTER — Ambulatory Visit
Admission: EM | Admit: 2021-03-20 | Discharge: 2021-03-20 | Disposition: A | Discharge status: Home / Self Care | Payer: Federal, State, Local not specified - PPO | Attending: Family Medicine | Admitting: Family Medicine

## 2021-03-20 ENCOUNTER — Encounter: Payer: Self-pay | Admitting: Emergency Medicine

## 2021-03-20 DIAGNOSIS — L309 Dermatitis, unspecified: Secondary | ICD-10-CM | POA: Diagnosis not present

## 2021-03-20 DIAGNOSIS — L03011 Cellulitis of right finger: Secondary | ICD-10-CM

## 2021-03-20 MED ORDER — SULFAMETHOXAZOLE-TRIMETHOPRIM 800-160 MG PO TABS: 1.0000 | ORAL_TABLET | Freq: Two times a day (BID) | ORAL | 0 refills | Status: AC

## 2021-03-20 MED ORDER — PREDNISONE 20 MG PO TABS: ORAL_TABLET | ORAL | 0 refills | Status: DC

## 2021-03-20 MED ORDER — DEXAMETHASONE SODIUM PHOSPHATE 10 MG/ML IJ SOLN
10.0000 mg | Freq: Once | INTRAMUSCULAR | Status: AC
  Administered 2021-03-20: 10 mg via INTRAMUSCULAR

## 2021-03-20 NOTE — ED Triage Notes (Signed)
Hands, peeling with rash since August.  Has seen a dermatologist on August  2 days after being seen in ED.  states he was given a shot in the ED that helped.
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
## 2021-04-08 ENCOUNTER — Ambulatory Visit
Admission: EM | Admit: 2021-04-08 | Discharge: 2021-04-08 | Disposition: A | Discharge status: Home / Self Care | Payer: Federal, State, Local not specified - PPO | Attending: Internal Medicine | Admitting: Internal Medicine

## 2021-04-08 ENCOUNTER — Other Ambulatory Visit: Payer: Self-pay

## 2021-04-08 DIAGNOSIS — L2489 Irritant contact dermatitis due to other agents: Secondary | ICD-10-CM | POA: Diagnosis not present

## 2021-04-08 DIAGNOSIS — B369 Superficial mycosis, unspecified: Secondary | ICD-10-CM

## 2021-04-08 MED ORDER — TERBINAFINE HCL 250 MG PO TABS: 250.0000 mg | ORAL_TABLET | Freq: Every day | ORAL | 0 refills | Status: AC

## 2021-04-08 MED ORDER — MICONAZOLE NITRATE POWD: Freq: Three times a day (TID) | Status: AC

## 2021-04-08 MED ORDER — MICONAZOLE NITRATE 2 % EX POWD: CUTANEOUS | 0 refills | Status: DC | PRN

## 2021-04-08 NOTE — Discharge Instructions (Addendum)
Please use medications as prescribed If you have worsening symptoms please return to urgent care to be reevaluated. 
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
## 2021-04-08 NOTE — ED Triage Notes (Signed)
Pt presents with ongoing rash on hands  where skin has began to crack

## 2021-05-02 ENCOUNTER — Ambulatory Visit: Payer: Self-pay | Admitting: *Deleted

## 2021-05-02 NOTE — Telephone Encounter (Signed)
Called and left vm to return call to the office 

## 2021-05-02 NOTE — Telephone Encounter (Signed)
Reason for Disposition  [1] Numbness or tingling in one or both hands AND [2] is a chronic symptom (recurrent or ongoing AND present > 4 weeks)  Answer Assessment - Initial Assessment Questions 1. SYMPTOM: "What is the main symptom you are concerned about?" (e.g., weakness, numbness)     Both hands numb 2. ONSET: "When did this start?" (minutes, hours, days; while sleeping)     April 3. LAST NORMAL: "When was the last time you (the patient) were normal (no symptoms)?"     April 4. PATTERN "Does this come and go, or has it been constant since it started?"  "Is it present now?"     Constant 5. CARDIAC SYMPTOMS: "Have you had any of the following symptoms: chest pain, difficulty breathing, palpitations?"     No 6. NEUROLOGIC SYMPTOMS: "Have you had any of the following symptoms: headache, dizziness, vision loss, double vision, changes in speech, unsteady on your feet?"     no 7. OTHER SYMPTOMS: "Do you have any other symptoms?"     Scarring on arms but numbness of hands  Protocols used: Neurologic Deficit-A-AH

## 2021-05-02 NOTE — Telephone Encounter (Signed)
PCP state pt needs to call his insurance and find a PCP in network
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
## 2021-05-16 ENCOUNTER — Telehealth: Payer: Self-pay | Admitting: Nurse Practitioner

## 2021-05-16 NOTE — Telephone Encounter (Signed)
Referral Request - Has patient seen PCP for this complaint? yes *If NO, is insurance requiring patient see PCP for this issue before PCP can refer them? yn Referral for which specialty: neurologist Preferred provider/office: N/A Reason for referral: loss of feeling in arms in legs

## 2021-05-16 NOTE — Telephone Encounter (Signed)
Needs office visit. If this is acute onset he needs to be seen in ED or urgent care

## 2021-05-17 NOTE — Telephone Encounter (Signed)
Pt stated no acute symptoms.

## 2021-05-17 NOTE — Telephone Encounter (Signed)
Called pt scheduled to see [facility]. Pt agreed

## 2021-05-21 ENCOUNTER — Ambulatory Visit: Payer: Federal, State, Local not specified - PPO | Admitting: Physician Assistant

## 2021-05-23 ENCOUNTER — Other Ambulatory Visit: Payer: Self-pay

## 2021-05-23 ENCOUNTER — Ambulatory Visit: Payer: Federal, State, Local not specified - PPO | Attending: Physician Assistant | Admitting: Physician Assistant

## 2021-05-23 VITALS — BP 127/86 | HR 98 | Temp 98.7°F | Resp 18 | Ht 71.0 in | Wt 249.0 lb

## 2021-05-23 DIAGNOSIS — T300 Burn of unspecified body region, unspecified degree: Secondary | ICD-10-CM | POA: Diagnosis not present

## 2021-05-23 DIAGNOSIS — L309 Dermatitis, unspecified: Secondary | ICD-10-CM | POA: Diagnosis not present

## 2021-05-23 DIAGNOSIS — G629 Polyneuropathy, unspecified: Secondary | ICD-10-CM | POA: Diagnosis not present

## 2021-05-23 MED ORDER — GABAPENTIN 100 MG PO CAPS: 100.0000 mg | ORAL_CAPSULE | Freq: Three times a day (TID) | ORAL | 0 refills | Status: DC

## 2021-05-23 NOTE — Patient Instructions (Signed)
You will start taking gabapentin 100 mg, take tonight and then tomorrow take twice tomorrow and then start taking three times a day.  Roney Jaffe, PA-C Physician Assistant [facility] [email or web address]   Neuropathic Pain Neuropathic pain is pain caused by damage to the nerves that are responsible for certain sensations in your body (sensory nerves). The pain can be caused by: Damage to the sensory nerves that send signals to your spinal cord and brain (peripheral nervous system). Damage to the sensory nerves in your brain or spinal cord (central nervous system). Neuropathic pain can make you more sensitive to pain. Even a minor sensation can feel very painful. This is usually a long-term condition that can be difficult to treat. The type of pain differs from person to person. It may: Start suddenly (acute), or it may develop slowly and last for a long time (chronic). Come and go as damaged nerves heal, or it may stay at the same level for years. Cause emotional distress, loss of sleep, and a lower quality of life. What are the causes? The most common cause of this condition is diabetes. Many other diseases and conditions can also cause neuropathic pain. Causes of neuropathic pain can be classified as: Toxic. This is caused by medicines and chemicals. The most common cause of toxic neuropathic pain is damage from cancer treatments (chemotherapy). Metabolic. This can be caused by: Diabetes. This is the most common disease that damages the nerves. Lack of vitamin B from long-term alcohol abuse. Traumatic. Any injury that cuts, crushes, or stretches a nerve can cause damage and pain. A common example is feeling pain after losing an arm or leg (phantom limb pain). Compression-related. If a sensory nerve gets trapped or compressed for a long period of time, the blood supply to the nerve can be cut off. Vascular. Many blood vessel diseases  can cause neuropathic pain by decreasing blood supply and oxygen to nerves. Autoimmune. This type of pain results from diseases in which the body's defense system (immune system) mistakenly attacks sensory nerves. Examples of autoimmune diseases that can cause neuropathic pain include lupus and multiple sclerosis. Infectious. Many types of viral infections can damage sensory nerves and cause pain. Shingles infection is a common cause of this type of pain. Inherited. Neuropathic pain can be a symptom of many diseases that are passed down through families (genetic). What increases the risk? You are more likely to develop this condition if: You have diabetes. You smoke. You drink too much alcohol. You are taking certain medicines, including medicines that kill cancer cells (chemotherapy) or that treat immune system disorders. What are the signs or symptoms? The main symptom is pain. Neuropathic pain is often described as: Burning. Shock-like. Stinging. Hot or cold. Itching. How is this diagnosed? No single test can diagnose neuropathic pain. It is diagnosed based on: Physical exam and your symptoms. Your health care provider will ask you about your pain. You may be asked to use a pain scale to describe how bad your pain is. Tests. These may be done to see if you have a high sensitivity to pain and to help find the cause and location of any sensory nerve damage. They include: Nerve conduction studies to test how well nerve signals travel through your sensory nerves (electrodiagnostic testing). Stimulating your sensory nerves through electrodes on your skin and measuring the response in your spinal cord and brain (somatosensory evoked potential). Imaging studies, such as: X-rays. CT scan. MRI. How is this treated? Treatment  for neuropathic pain may change over time. You may need to try different treatment options or a combination of treatments. Some options include: Treating the underlying  cause of the neuropathy, such as diabetes, kidney disease, or vitamin deficiencies. Stopping medicines that can cause neuropathy, such as chemotherapy. Medicine to relieve pain. Medicines may include: Prescription or over-the-counter pain medicine. Anti-seizure medicine. Antidepressant medicines. Pain-relieving patches that are applied to painful areas of skin. A medicine to numb the area (local anesthetic), which can be injected as a nerve block. Transcutaneous nerve stimulation. This uses electrical currents to block painful nerve signals. The treatment is painless. Alternative treatments, such as: Acupuncture. Meditation. Massage. Physical therapy. Pain management programs. Counseling. Follow these instructions at home: Medicines  Take over-the-counter and prescription medicines only as told by your health care provider. Do not drive or use heavy machinery while taking prescription pain medicine. If you are taking prescription pain medicine, take actions to prevent or treat constipation. Your health care provider may recommend that you: Drink enough fluid to keep your urine pale yellow. Eat foods that are high in fiber, such as fresh fruits and vegetables, whole grains, and beans. Limit foods that are high in fat and processed sugars, such as fried or sweet foods. Take an over-the-counter or prescription medicine for constipation. Lifestyle  Have a good support system at home. Consider joining a chronic pain support group. Do not use any products that contain nicotine or tobacco, such as cigarettes and e-cigarettes. If you need help quitting, ask your health care provider. Do not drink alcohol. General instructions Learn as much as you can about your condition. Work closely with all your health care providers to find the treatment plan that works best for you. Ask your health care provider what activities are safe for you. Keep all follow-up visits as told by your health care  provider. This is important. Contact a health care provider if: Your pain treatments are not working. You are having side effects from your medicines. You are struggling with tiredness (fatigue), mood changes, depression, or anxiety. Summary Neuropathic pain is pain caused by damage to the nerves that are responsible for certain sensations in your body (sensory nerves). Neuropathic pain may come and go as damaged nerves heal, or it may stay at the same level for years. Neuropathic pain is usually a long-term condition that can be difficult to treat. Consider joining a chronic pain support group. This information is not intended to replace advice given to you by your health care provider. Make sure you discuss any questions you have with your health care provider. Document Revised: 11/12/2018 Document Reviewed: 08/08/2017 Elsevier Patient Education  2022 [employer].
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
## 2021-05-25 ENCOUNTER — Encounter: Payer: Self-pay | Admitting: Physician Assistant

## 2021-06-10 NOTE — Progress Notes (Incomplete)
Established Patient Office Visit  Subjective:  Patient ID: Phillip Leach, male    DOB: 07-06-1996  Age: 25 y.o. MRN: [id number]  CC: No chief complaint on file.   HPI Jamicheal Heard presents for   PCP to est.  Hx of recurrent hand rash.  Creams ineffective   has Antonieta Loveless , just saw [facility] 05/23/21    Past Medical History:  Diagnosis Date   ADHD (attention deficit hyperactivity disorder)    Anxiety    Asthma    Bipolar 1 disorder (HCC)    Depression    Eczema    GERD (gastroesophageal reflux disease)    Seasonal allergies     Past Surgical History:  Procedure Laterality Date   ADENOIDECTOMY     APPENDECTOMY     LAPAROSCOPIC APPENDECTOMY N/A 08/19/2013   Procedure: APPENDECTOMY LAPAROSCOPIC;  Surgeon: Harl Bowie, MD;  Location: [facility];  Service: General;  Laterality: N/A;   TONSILLECTOMY      Family History  Problem Relation Age of Onset   Cancer Paternal Grandmother        ovarian    Social History   Socioeconomic History   Marital status: Single    Spouse name: Not on file   Number of children: Not on file   Years of education: Not on file   Highest education level: Not on file  Occupational History   Not on file  Tobacco Use   Smoking status: Former    Packs/day: 0.25    Types: Cigarettes    Quit date: 04/10/2015    Years since quitting: 6.1   Smokeless tobacco: Never  Substance and Sexual Activity   Alcohol use: Not Currently   Drug use: Not Currently    Types: Marijuana   Sexual activity: Yes  Other Topics Concern   Not on file  Social History Narrative   Not on file   Social Determinants of Health   Financial Resource Strain: Not on file  Food Insecurity: Not on file  Transportation Needs: Not on file  Physical Activity: Not on file  Stress: Not on file  Social Connections: Not on file  Intimate Partner Violence: Not on file    Outpatient Medications Prior to Visit  Medication Sig Dispense  Refill   albuterol (VENTOLIN HFA) 108 (90 Base) MCG/ACT inhaler Inhale 2 puffs into the lungs every 6 (six) hours as needed for wheezing or shortness of breath. 8 g 0   EPINEPHrine (EPIPEN 2-PAK) 0.3 mg/0.3 mL IJ SOAJ injection Inject 0.3 mg into the muscle as needed for anaphylaxis. 1 each 0   gabapentin (NEURONTIN) 100 MG capsule Take 1 capsule (100 mg total) by mouth 3 (three) times daily. 90 capsule 0   hydrOXYzine (ATARAX/VISTARIL) 25 MG tablet Take 1 tablet (25 mg total) by mouth every 6 (six) hours. May cause drowsiness 15 tablet 0   miconazole (MICOTIN) 2 % powder Apply topically as needed for itching. 70 g 0   predniSONE (DELTASONE) 20 MG tablet Take 3 PO QAM x3days, 2 PO QAM x3days, 1 PO QAM x3days 18 tablet 0   promethazine (PHENERGAN) 6.25 MG/5ML syrup Take 20 mLs (25 mg total) by mouth every 6 (six) hours as needed (allegy symptoms). 240 mL 3   promethazine-codeine (PHENERGAN WITH CODEINE) 6.25-10 MG/5ML syrup Take 5 mLs by mouth every 4 (four) hours as needed for cough. 60 mL 0   triamcinolone cream (KENALOG) 0.1 % Apply 1 application topically 3 (three) times daily. 45 g 0  Facility-Administered Medications Prior to Visit  Medication Dose Route Frequency Provider Last Rate Last Admin   miconazole nitrate (MICATIN) topical powder   Topical TID Lamptey, Myrene Galas, MD        Allergies  Allergen Reactions   Minocin [Minocycline Hcl] Other (See Comments)    Unknown    Molds & Smuts    Shellfish Allergy Other (See Comments)    Unknown    Amoxicillin-Pot Clavulanate Rash   Penicillins Rash    ROS Review of Systems    Objective:    Physical Exam  There were no vitals taken for this visit. Wt Readings from Last 3 Encounters:  05/23/21 249 lb (112.9 kg)  12/06/20 260 lb (117.9 kg)  11/13/20 254 lb (115.2 kg)     Health Maintenance Due  Topic Date Due   COVID-19 Vaccine (1) Never done   Pneumococcal Vaccine 48-64 Years old (1 - PCV) Never done    HPV VACCINES (1 - Male 2-dose series) Never done   INFLUENZA VACCINE  Never done       Topic Date Due   HPV VACCINES (1 - Male 2-dose series) Never done    No results found for: TSH Lab Results  Component Value Date   WBC 6.1 06/06/2020   HGB 14.5 06/06/2020   HCT 45.5 06/06/2020   MCV 85 06/06/2020   PLT 228 06/06/2020   Lab Results  Component Value Date   NA 140 12/06/2020   K 4.2 12/06/2020   CO2 23 12/06/2020   GLUCOSE 93 12/06/2020   BUN 10 12/06/2020   CREATININE 1.14 12/06/2020   BILITOT 0.3 12/06/2020   ALKPHOS 86 12/06/2020   AST 24 12/06/2020   ALT 31 12/06/2020   PROT 6.7 12/06/2020   ALBUMIN 4.5 12/06/2020   CALCIUM 9.6 12/06/2020   ANIONGAP 10 06/28/2019   EGFR 92 12/06/2020   No results found for: CHOL No results found for: HDL No results found for: LDLCALC No results found for: TRIG No results found for: CHOLHDL Lab Results  Component Value Date   HGBA1C 5.8 (H) 12/06/2020      Assessment & Plan:   Problem List Items Addressed This Visit   None   No orders of the defined types were placed in this encounter.   Follow-up: No follow-ups on file.    Asencion Noble, MD

## 2021-06-11 ENCOUNTER — Ambulatory Visit: Payer: Federal, State, Local not specified - PPO | Admitting: Critical Care Medicine

## 2021-06-29 ENCOUNTER — Encounter: Payer: Self-pay | Admitting: Nurse Practitioner

## 2021-07-19 ENCOUNTER — Other Ambulatory Visit: Payer: Federal, State, Local not specified - PPO

## 2021-12-26 ENCOUNTER — Telehealth: Payer: Medicaid Other | Admitting: Physician Assistant

## 2021-12-26 ENCOUNTER — Ambulatory Visit: Payer: Self-pay | Admitting: *Deleted

## 2021-12-26 NOTE — Telephone Encounter (Signed)
  Chief Complaint: allergy flare, dermatitis Symptoms: itching, eyes watery Frequency: ongoing Pertinent Negatives: Patient denies congestion Disposition: [] ED /[] Urgent Care (no appt availability in office) / [] Appointment(In office/virtual)/ [x]  [facility]/ [] Home Care/ [] Refused Recommended Disposition /[] [facility]/ []  Follow-up with PCP Additional Notes:  Options given for care- patient request UC virtual

## 2021-12-26 NOTE — Telephone Encounter (Signed)
Summary: Med request with symptoms   Pt called to report that he has allergy symptoms, nausea, he is requesting promethazine (PHENERGAN) 6.25 MG/5ML syrup   [id number] - [place], [place] - [address] AT [facility] OF [address]  [address] [place] [address]  Phone: [phone] Fax: [phone]      Reason for Disposition  [1] Taking antihistamines > 2 days AND [2] nasal allergy symptoms interfere with sleep, school, or work  Answer Assessment - Initial Assessment Questions 1. SYMPTOM: "What's the main symptom you're concerned about?" (e.g., runny nose, stuffiness, sneezing, itching)     Dermatitis flare- allergy flare, sinus drainage 2. SEVERITY: "How bad is it?" "What does it keep you from doing?" (e.g., sleeping, working)      severe 3. EYES: "Are the eyes also red, watery, and itchy?"      yes 4. TRIGGER: "What pollen or other allergic substance do you think is causing the symptoms?"      pollen 5. TREATMENT: "What medicine are you using?" "What medicine worked best in the past?"     Benadryl- makes sleepy 6. OTHER SYMPTOMS: "Do you have any other symptoms?" (e.g., coughing, difficulty breathing, wheezing)       7. PREGNANCY: "Is there any chance you are pregnant?" "When was your last menstrual period?"  Protocols used: Nasal Allergies (Hay Fever)-A-AH

## 2021-12-26 NOTE — Progress Notes (Signed)
The patient no-showed for appointment despite this provider sending direct link, reaching out via phone with no response and waiting for at least 10 minutes from appointment time for patient to join. They will be marked as a NS for this appointment/time.  ? ?Phillip Oelkers [name], PA-C ? ? ? ?

## 2022-01-08 ENCOUNTER — Ambulatory Visit: Payer: Self-pay

## 2022-01-08 ENCOUNTER — Other Ambulatory Visit: Payer: Self-pay | Admitting: Nurse Practitioner
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
## 2022-01-08 NOTE — Telephone Encounter (Signed)
Copied from CRM [id number]. Topic: General - Other >> Jan 08, 2022  8:58 AM Gaetana Michaelis A wrote: Reason for CRM: Medication Refill - Medication: promethazine-codeine (PHENERGAN WITH CODEINE) 6.25-10 MG/5ML syrup [[id number]]   Has the patient contacted their pharmacy? No.The patient ws uncertain of the medication's status  (Agent: If no, request that the patient contact the pharmacy for the refill. If patient does not wish to contact the pharmacy document the reason why and proceed with request.) (Agent: If yes, when and what did the pharmacy advise?)  Preferred Pharmacy (with phone number or street name): [id number] [place], [place] - [address] [place] [address] Phone: [phone] Fax: [phone] Hours: Not open 24 hours  Has the patient been seen for an appointment in the last year OR does the patient have an upcoming appointment? Yes.    Agent: Please be advised that RX refills may take up to 3 business days. We ask that you follow-up with your pharmacy.

## 2022-01-08 NOTE — Telephone Encounter (Signed)
  Chief Complaint: Widespread rash - itching Symptoms: ibid Frequency: over 1 year Pertinent Negatives: Patient denies fever, infection Disposition: [] ED /[] Urgent Care (no appt availability in office) / [] Appointment(In office/virtual)/ []  [place] [facility]/ [] Home Care/ [] Refused Recommended Disposition /[] [place] [facility]/ [x]  Follow-up with PCP Additional Notes: Pt does not wish to be seen if possible. Pt would like a refill of Phenergan liquid.for itching as he was given before. Pt is taking a lot of benadryl for allergy s/s. Pt states that the benadryl makes him sleepy.     Summary: skin irritation   The patient is experiencing skin irritation on their forehead and hands   The patient shares that the irritation has occurred for more than a week   The patient has been previously triaged but continues to experience discomfort   Please contact further when possible      Reason for Disposition  SEVERE itching (i.e., interferes with sleep, normal activities or school)  Answer Assessment - Initial Assessment Questions 1. DESCRIPTION: "Describe the itching you are having."     Wide spread itching and rash 2. SEVERITY: "How bad is it?"    - MILD - doesn't interfere with normal activities   - MODERATE-SEVERE: interferes with work, school, sleep, or other activities      Moderate-severe 3. SCRATCHING: "Are there any scratch marks? Bleeding?"      4. ONSET: "When did this begin?"      Over 1 year 5. CAUSE: "What do you think is causing the itching?" (ask about swimming pools, pollen, animals, soaps, etc.)     Contact dermatitis. 6. OTHER SYMPTOMS: "Do you have any other symptoms?"      no 7. PREGNANCY: "Is there any chance you are pregnant?" "When was your last menstrual period?"     na  Protocols used: Itching - Widespread-A-AH, Rash or Redness - [facility]

## 2022-01-08 NOTE — Telephone Encounter (Signed)
Called and left vm  °

## 2022-01-08 NOTE — Telephone Encounter (Signed)
Copied from [place]. Topic: General - Other >> Jan 08, 2022  2:48 PM McGill, Nelva Bush wrote: Reason for CRM: Pt stated he had a missed call from [place]. Pt was calling back to speak with her and is requesting a call back. Pt stated that there were no further details in the message left.     Please advise.

## 2022-07-01 ENCOUNTER — Ambulatory Visit: Payer: Self-pay

## 2022-07-01 NOTE — Telephone Encounter (Signed)
Noted, provider aware

## 2022-07-01 NOTE — Telephone Encounter (Signed)
  Chief Complaint: rash Symptoms: rash on forehead to abdomen. Itching and burning on forehead.  Frequency: 3 weeks  Pertinent Negatives: NA Disposition: [] ED /[] Urgent Care (no appt availability in office) / [] Appointment(In office/virtual)/ []  [place] [facility]/ [] Home Care/ [] Refused Recommended Disposition /[x] [place] [facility]/ []  Follow-up with PCP Additional Notes: pt states he has been incarcerated past 3 months and now has rash since being home. Unsure what causing rash. Not started new medicine. Was on BP med in jail and didn't get a prescription when released. Was told to FU with PCP. Advised no appts and recommended pt go to [facility] tomorrow. Location details provided. Pt verbalized understanding.   Reason for Disposition  Mild widespread rash  (Exception: Heat rash lasting 3 days or less.)  Answer Assessment - Initial Assessment Questions 1. APPEARANCE of RASH: "Describe the rash." (e.g., spots, blisters, raised areas, skin peeling, scaly)     Spots  3. LOCATION: "Where is the rash located?"     Face to abdomen, under R eye  5. ONSET: "When did the rash begin?"     3 weeks  7. ITCHING: "Does the rash itch?" If Yes, ask: "How bad is the itch?" (Scale 1-10; or mild, moderate, severe)     Yes and burning on forehead  9. MEDICINE FACTORS: "Have you started any new medicines within the last 2 weeks?" (e.g., antibiotics)      no 10. OTHER SYMPTOMS: "Do you have any other symptoms?" (e.g., dizziness, headache, sore throat, joint pain)  Protocols used: Rash or Redness - [facility]

## 2022-07-02 ENCOUNTER — Other Ambulatory Visit: Payer: Self-pay

## 2022-07-02 ENCOUNTER — Encounter: Payer: Self-pay | Admitting: Physician Assistant

## 2022-07-02 ENCOUNTER — Ambulatory Visit: Payer: Self-pay | Admitting: Physician Assistant

## 2022-07-02 VITALS — BP 144/104 | HR 69 | Ht 72.0 in | Wt 266.0 lb

## 2022-07-02 DIAGNOSIS — Z13220 Encounter for screening for lipoid disorders: Secondary | ICD-10-CM

## 2022-07-02 DIAGNOSIS — I10 Essential (primary) hypertension: Secondary | ICD-10-CM

## 2022-07-02 DIAGNOSIS — Z889 Allergy status to unspecified drugs, medicaments and biological substances status: Secondary | ICD-10-CM

## 2022-07-02 DIAGNOSIS — R051 Acute cough: Secondary | ICD-10-CM

## 2022-07-02 DIAGNOSIS — Z91013 Allergy to seafood: Secondary | ICD-10-CM

## 2022-07-02 DIAGNOSIS — R7303 Prediabetes: Secondary | ICD-10-CM

## 2022-07-02 DIAGNOSIS — L309 Dermatitis, unspecified: Secondary | ICD-10-CM

## 2022-07-02 MED ORDER — HYDROCHLOROTHIAZIDE 12.5 MG PO TABS
12.5000 mg | ORAL_TABLET | Freq: Every day | ORAL | 1 refills | Status: DC
  Filled 2022-07-02: qty 20, 20d supply, fill #0
  Filled 2022-07-02: qty 30, 30d supply, fill #0
  Filled 2022-07-02: qty 10, 10d supply, fill #0

## 2022-07-02 MED ORDER — CETIRIZINE HCL 10 MG PO TABS
10.0000 mg | ORAL_TABLET | Freq: Every day | ORAL | 11 refills | Status: DC
  Filled 2022-07-02: qty 30, 30d supply, fill #0

## 2022-07-02 MED ORDER — METHYLPREDNISOLONE ACETATE 40 MG/ML IJ SUSP
40.0000 mg | Freq: Once | INTRAMUSCULAR | Status: AC
  Administered 2022-07-02: 40 mg via INTRAMUSCULAR

## 2022-07-02 MED ORDER — EPINEPHRINE 0.3 MG/0.3ML IJ SOAJ
0.3000 mg | INTRAMUSCULAR | 0 refills | Status: DC | PRN
  Filled 2022-07-02: qty 1, fill #0

## 2022-07-02 NOTE — Progress Notes (Unsigned)
   Established Patient Office Visit  Subjective   Patient ID: Phillip Leach, male    DOB: [birth year]  Age: 26 y.o. MRN: [id number]  Chief Complaint  Patient presents with   URI    Manson Passey, greenish mucus    Rash    Patient feels like he may have a Skin infection, rash is painful and gets inflamed    Hypertension    Was taking medication while in jail, started 03/2022 ended I 05/27/2022    States that he was incarcerated fr 3 months and since being home for   Started having a rash   No new detergents , medications, lotions, fragrances  No pets in house  No one in house similar   Has used hydrocortisone and kenalog without relief    States that he was treated for htn while incarcerated, and was told to follow up with PCP for meds,  States that he was not given a script to go home with  amlodipine  States that he does not check at home , but does have access      Greensih borwn sputum for the past week and a half   {History (Optional):23778}  ROS    Objective:     BP (!) 144/104 (BP Location: Left Arm, Patient Position: Sitting, Cuff Size: Large)   Pulse 69   Ht 6' (1.829 m)   Wt 266 lb (120.7 kg)   SpO2 99%   BMI 36.08 kg/m  {Vitals History (Optional):23777}  Physical Exam   No results found for any visits on 07/02/22.  {Labs (Optional):23779}  The ASCVD Risk score (Arnett DK, et al., 2019) failed to calculate for the following reasons:   The 2019 ASCVD risk score is only valid for ages 60 to 96    Assessment & Plan:   Problem List Items Addressed This Visit       Other   Prediabetes   Other Visit Diagnoses     Essential hypertension    -  Primary       No follow-ups on file.    Kasandra Knudsen [place], PA-C

## 2022-07-02 NOTE — Patient Instructions (Addendum)
To help with your blood pressure, you are going to start taking hydrochlorothiazide 12.5 mg once daily in the morning.  I encourage you to check your blood pressure on a daily basis, keep a written log and have available for all office visits.  I do encourage you to return to the mobile unit if your blood pressure readings do not improve.  To help with your rash, you are going to be given a steroid injection today.  I have sent a refill of your EpiPen to the pharmacy.  To help with your ongoing allergies, you are going to start taking Zyrtec on a daily basis.  We will call you with today's lab results.  Roney Jaffe, PA-C Physician Assistant [facility] Medicine [email or web address]   How to Take Your Blood Pressure Blood pressure is a measurement of how strongly your blood is pressing against the walls of your arteries. Arteries are blood vessels that carry blood from your heart throughout your body. Your health care provider takes your blood pressure at each office visit. You can also take your own blood pressure at home with a blood pressure monitor. You may need to take your own blood pressure to: Confirm a diagnosis of high blood pressure (hypertension). Monitor your blood pressure over time. Make sure your blood pressure medicine is working. Supplies needed: Blood pressure monitor. A chair to sit in. This should be a chair where you can sit upright with your back supported. Do not sit on a soft couch or an armchair. Table or desk. Small notebook and pencil or pen. How to prepare To get the most accurate reading, avoid the following for 30 minutes before you check your blood pressure: Drinking caffeine. Drinking alcohol. Eating. Smoking. Exercising. Five minutes before you check your blood pressure: Use the bathroom and urinate so that you have an empty bladder. Sit quietly in a chair. Do not talk. How to take your blood  pressure To check your blood pressure, follow the instructions in the manual that came with your blood pressure monitor. If you have a digital blood pressure monitor, the instructions may be as follows: Sit up straight in a chair. Place your feet on the floor. Do not cross your ankles or legs. Rest your left arm at the level of your heart on a table or desk or on the arm of a chair. Pull up your shirt sleeve. Wrap the blood pressure cuff around the upper part of your left arm, 1 inch (2.5 cm) above your elbow. It is best to wrap the cuff around bare skin. Fit the cuff snugly, but not too tightly, around your arm. You should be able to place only one finger between the cuff and your arm. Position the cord so that it rests in the bend of your elbow. Press the power button. Sit quietly while the cuff inflates and deflates. Read the digital reading on the monitor screen and write the numbers down (record them) in a notebook. Wait 2-3 minutes, then repeat the steps, starting at step 1. What does my blood pressure reading mean? A blood pressure reading consists of a higher number over a lower number. Ideally, your blood pressure should be below 120/80. The first ("top") number is called the systolic pressure. It is a measure of the pressure in your arteries as your heart beats. The second ("bottom") number is called the diastolic pressure. It is a measure of the pressure in your arteries as the heart relaxes. Blood pressure is classified  into four stages. The following are the stages for adults who do not have a short-term serious illness or a chronic condition. Systolic pressure and diastolic pressure are measured in a unit called mm Hg (millimeters of mercury).  Normal Systolic pressure: below 120. Diastolic pressure: below 80. Elevated Systolic pressure: 120-129. Diastolic pressure: below 80. Hypertension stage 1 Systolic pressure: 130-139. Diastolic pressure: 80-89. Hypertension stage  2 Systolic pressure: 140 or above. Diastolic pressure: 90 or above. You can have elevated blood pressure or hypertension even if only the systolic or only the diastolic number in your reading is higher than normal. Follow these instructions at home: Medicines Take over-the-counter and prescription medicines only as told by your health care provider. Tell your health care provider if you are having any side effects from blood pressure medicine. General instructions Check your blood pressure as often as recommended by your health care provider. Check your blood pressure at the same time every day. Take your monitor to the next appointment with your health care provider to make sure that: You are using it correctly. It provides accurate readings. Understand what your goal blood pressure numbers are. Keep all follow-up visits. This is important. General tips Your health care provider can suggest a reliable monitor that will meet your needs. There are several types of home blood pressure monitors. Choose a monitor that has an arm cuff. Do not choose a monitor that measures your blood pressure from your wrist or finger. Choose a cuff that wraps snugly, not too tight or too loose, around your upper arm. You should be able to fit only one finger between your arm and the cuff. You can buy a blood pressure monitor at most drugstores or online. Where to find more information American Heart Association: www.heart.org Contact a health care provider if: Your blood pressure is consistently high. Your blood pressure is suddenly low. Get help right away if: Your systolic blood pressure is higher than 180. Your diastolic blood pressure is higher than 120. These symptoms may be an emergency. Get help right away. Call 911. Do not wait to see if the symptoms will go away. Do not drive yourself to the hospital. Summary Blood pressure is a measurement of how strongly your blood is pressing against the  walls of your arteries. A blood pressure reading consists of a higher number over a lower number. Ideally, your blood pressure should be below 120/80. Check your blood pressure at the same time every day. Avoid caffeine, alcohol, smoking, and exercise for 30 minutes prior to checking your blood pressure. These agents can affect the accuracy of the blood pressure reading. This information is not intended to replace advice given to you by your health care provider. Make sure you discuss any questions you have with your health care provider. Document Revised: 04/05/2021 Document Reviewed: 04/05/2021 Elsevier Patient Education  2023 [employer].

## 2022-07-03 ENCOUNTER — Encounter: Payer: Self-pay | Admitting: Physician Assistant

## 2022-07-03 DIAGNOSIS — Z889 Allergy status to unspecified drugs, medicaments and biological substances status: Secondary | ICD-10-CM | POA: Insufficient documentation

## 2022-07-03 DIAGNOSIS — I10 Essential (primary) hypertension: Secondary | ICD-10-CM | POA: Insufficient documentation

## 2022-07-03 DIAGNOSIS — Z91013 Allergy to seafood: Secondary | ICD-10-CM | POA: Insufficient documentation

## 2022-07-03 LAB — LIPID PANEL
Chol/HDL Ratio: 2.6 ratio (ref 0.0–5.0)
Cholesterol, Total: 136 mg/dL (ref 100–199)
HDL: 52 mg/dL
LDL Chol Calc (NIH): 70 mg/dL (ref 0–99)
Triglycerides: 71 mg/dL (ref 0–149)
VLDL Cholesterol Cal: 14 mg/dL (ref 5–40)

## 2022-07-03 LAB — COMP. METABOLIC PANEL (12)
AST: 24 IU/L (ref 0–40)
Albumin/Globulin Ratio: 2 (ref 1.2–2.2)
Albumin: 4.3 g/dL (ref 4.3–5.2)
Alkaline Phosphatase: 88 IU/L (ref 44–121)
BUN/Creatinine Ratio: 8 — ABNORMAL LOW (ref 9–20)
BUN: 8 mg/dL (ref 6–20)
Bilirubin Total: 0.3 mg/dL (ref 0.0–1.2)
Calcium: 9.5 mg/dL (ref 8.7–10.2)
Chloride: 105 mmol/L (ref 96–106)
Creatinine, Ser: 1 mg/dL (ref 0.76–1.27)
Globulin, Total: 2.2 g/dL (ref 1.5–4.5)
Glucose: 80 mg/dL (ref 70–99)
Potassium: 4.2 mmol/L (ref 3.5–5.2)
Sodium: 140 mmol/L (ref 134–144)
Total Protein: 6.5 g/dL (ref 6.0–8.5)
eGFR: 106 mL/min/1.73

## 2022-07-03 LAB — CBC WITH DIFFERENTIAL/PLATELET
Basophils Absolute: 0 10*3/uL (ref 0.0–0.2)
Basos: 1 %
EOS (ABSOLUTE): 0.2 10*3/uL (ref 0.0–0.4)
Eos: 4 %
Hematocrit: 41.5 % (ref 37.5–51.0)
Hemoglobin: 13.7 g/dL (ref 13.0–17.7)
Immature Grans (Abs): 0 10*3/uL (ref 0.0–0.1)
Immature Granulocytes: 0 %
Lymphocytes Absolute: 2.6 10*3/uL (ref 0.7–3.1)
Lymphs: 44 %
MCH: 27.7 pg (ref 26.6–33.0)
MCHC: 33 g/dL (ref 31.5–35.7)
MCV: 84 fL (ref 79–97)
Monocytes Absolute: 0.5 10*3/uL (ref 0.1–0.9)
Monocytes: 9 %
Neutrophils Absolute: 2.3 10*3/uL (ref 1.4–7.0)
Neutrophils: 42 %
Platelets: 216 10*3/uL (ref 150–450)
RBC: 4.94 x10E6/uL (ref 4.14–5.80)
RDW: 13.1 % (ref 11.6–15.4)
WBC: 5.6 10*3/uL (ref 3.4–10.8)

## 2022-07-03 LAB — TSH: TSH: 1.88 u[IU]/mL (ref 0.450–4.500)

## 2022-08-20 ENCOUNTER — Ambulatory Visit: Payer: Medicaid Other | Attending: Nurse Practitioner | Admitting: Nurse Practitioner

## 2022-08-20 ENCOUNTER — Encounter: Payer: Self-pay | Admitting: Nurse Practitioner

## 2022-08-20 ENCOUNTER — Other Ambulatory Visit: Payer: Self-pay | Admitting: Nurse Practitioner

## 2022-08-20 VITALS — BP 127/73 | HR 78 | Ht 72.0 in | Wt 261.2 lb

## 2022-08-20 DIAGNOSIS — J4520 Mild intermittent asthma, uncomplicated: Secondary | ICD-10-CM | POA: Diagnosis not present

## 2022-08-20 DIAGNOSIS — Z79899 Other long term (current) drug therapy: Secondary | ICD-10-CM | POA: Diagnosis not present

## 2022-08-20 DIAGNOSIS — I10 Essential (primary) hypertension: Secondary | ICD-10-CM | POA: Diagnosis not present

## 2022-08-20 DIAGNOSIS — L301 Dyshidrosis [pompholyx]: Secondary | ICD-10-CM | POA: Diagnosis not present

## 2022-08-20 DIAGNOSIS — Z91013 Allergy to seafood: Secondary | ICD-10-CM | POA: Diagnosis not present

## 2022-08-20 MED ORDER — ALBUTEROL SULFATE HFA 108 (90 BASE) MCG/ACT IN AERS: 2.0000 | INHALATION_SPRAY | Freq: Four times a day (QID) | RESPIRATORY_TRACT | 0 refills | Status: DC | PRN

## 2022-08-20 MED ORDER — EPINEPHRINE 0.3 MG/0.3ML IJ SOAJ: 0.3000 mg | INTRAMUSCULAR | 0 refills | Status: DC | PRN

## 2022-08-20 MED ORDER — PROMETHAZINE-DM 6.25-15 MG/5ML PO SYRP: 5.0000 mL | ORAL_SOLUTION | Freq: Four times a day (QID) | ORAL | 0 refills | Status: DC | PRN

## 2022-08-20 MED ORDER — MOMETASONE FUROATE 0.1 % EX CREA: TOPICAL_CREAM | Freq: Every day | CUTANEOUS | 2 refills | Status: AC

## 2022-08-20 MED ORDER — AZITHROMYCIN 250 MG PO TABS: ORAL_TABLET | ORAL | 0 refills | Status: AC

## 2022-08-20 NOTE — Patient Instructions (Signed)
Dyshidrotic eczema

## 2022-08-20 NOTE — Progress Notes (Signed)
Assessment & Plan:  Phillip Leach was seen today for hypertension.  Diagnoses and all orders for this visit:  Dyshidrotic eczema -     Discontinue: promethazine-dextromethorphan (PROMETHAZINE-DM) 6.25-15 MG/5ML syrup; Take 5 mLs by mouth 4 (four) times daily as needed for cough. -     Ambulatory referral to Dermatology -     mometasone (ELOCON) 0.1 % cream; Apply topically daily. -     promethazine-dextromethorphan (PROMETHAZINE-DM) 6.25-15 MG/5ML syrup; Take 5 mLs by mouth 4 (four) times daily as needed for cough.  Mild intermittent asthma without complication -     albuterol (VENTOLIN HFA) 108 (90 Base) MCG/ACT inhaler; Inhale 2 puffs into the lungs every 6 (six) hours as needed for wheezing or shortness of breath. -     azithromycin (ZITHROMAX) 250 MG tablet; Take 2 tablets on day 1, then 1 tablet daily on days 2 through 5  Shellfish allergy -     EPINEPHrine (EPIPEN 2-PAK) 0.3 mg/0.3 mL IJ SOAJ injection; Inject 0.3 mg into the muscle as needed for anaphylaxis.    Patient has been counseled on age-appropriate routine health concerns for screening and prevention. These are reviewed and up-to-date. Referrals have been placed accordingly. Immunizations are up-to-date or declined.    Subjective:   Chief Complaint  Patient presents with   Hypertension   [name] 27 y.o. male presents to office today for BP follow up and with complaint of worsening dyshidrotic eczema.   Eczema Onset of symptoms several years ago, and have been unchanged since that time.  Risk factors include seasonal and perennial allergies. Treatment modalities that have been used in the past include: kenalog 0.1 %, antihistamines, po steroids, toradol He had a biopsy in the past and was diagnosed with eczema but he seems to believe he has some form of a bacterial skin infection or mold allergy. He has also had allergy testing in the past.    HTN He has not been taking HCTZ over the past week or so. Does not  think he needs this medication. We will discontinue at this time and observe BP.  BP Readings from Last 3 Encounters:  08/20/22 127/73  07/02/22 (!) 144/104  05/23/21 127/86    a Review of Systems  Constitutional:  Negative for fever, malaise/fatigue and weight loss.  HENT: Negative.  Negative for nosebleeds.   Eyes: Negative.  Negative for blurred vision, double vision and photophobia.  Respiratory: Negative.  Negative for cough and shortness of breath.   Cardiovascular: Negative.  Negative for chest pain, palpitations and leg swelling.  Gastrointestinal: Negative.  Negative for heartburn, nausea and vomiting.  Musculoskeletal: Negative.  Negative for myalgias.  Skin:  Positive for itching and rash.  Neurological: Negative.  Negative for dizziness, focal weakness, seizures and headaches.  Psychiatric/Behavioral: Negative.  Negative for suicidal ideas.     Past Medical History:  Diagnosis Date   ADHD (attention deficit hyperactivity disorder)    Anxiety    Asthma    Bipolar 1 disorder (HCC)    Depression    Eczema    GERD (gastroesophageal reflux disease)    Seasonal allergies     Past Surgical History:  Procedure Laterality Date   ADENOIDECTOMY     APPENDECTOMY     LAPAROSCOPIC APPENDECTOMY N/A 08/19/2013   Procedure: APPENDECTOMY LAPAROSCOPIC;  Surgeon: Harl Bowie, MD;  Location: [facility] OR;  Service: General;  Laterality: N/A;   TONSILLECTOMY      Family History  Problem Relation Age of Onset  Cancer Paternal Grandmother        ovarian    Social History Reviewed with no changes to be made today.   Outpatient Medications Prior to Visit  Medication Sig Dispense Refill   albuterol (VENTOLIN HFA) 108 (90 Base) MCG/ACT inhaler Inhale 2 puffs into the lungs every 6 (six) hours as needed for wheezing or shortness of breath. 8 g 0   hydrochlorothiazide (HYDRODIURIL) 12.5 MG tablet Take 1 tablet (12.5 mg total) by mouth daily. 30 tablet 1   cetirizine (ZYRTEC  ALLERGY) 10 MG tablet Take 1 tablet (10 mg total) by mouth daily. (Patient not taking: Reported on 08/20/2022) 30 tablet 11   EPINEPHrine (EPIPEN 2-PAK) 0.3 mg/0.3 mL IJ SOAJ injection Inject 0.3 mg into the muscle as needed for anaphylaxis. (Patient not taking: Reported on 08/20/2022) 1 each 0   gabapentin (NEURONTIN) 100 MG capsule Take 1 capsule (100 mg total) by mouth 3 (three) times daily. (Patient not taking: Reported on 07/02/2022) 90 capsule 0   miconazole (MICOTIN) 2 % powder Apply topically as needed for itching. (Patient not taking: Reported on 07/02/2022) 70 g 0   promethazine (PHENERGAN) 6.25 MG/5ML syrup Take 20 mLs (25 mg total) by mouth every 6 (six) hours as needed (allegy symptoms). (Patient not taking: Reported on 07/02/2022) 240 mL 3   promethazine-codeine (PHENERGAN WITH CODEINE) 6.25-10 MG/5ML syrup Take 5 mLs by mouth every 4 (four) hours as needed for cough. (Patient not taking: Reported on 07/02/2022) 60 mL 0   triamcinolone cream (KENALOG) 0.1 % Apply 1 application topically 3 (three) times daily. (Patient not taking: Reported on 07/02/2022) 45 g 0   Facility-Administered Medications Prior to Visit  Medication Dose Route Frequency Provider Last Rate Last Admin   miconazole nitrate (MICATIN) topical powder   Topical TID Lamptey, Myrene Galas, MD        Allergies  Allergen Reactions   Minocin [Minocycline Hcl] Other (See Comments)    Unknown    Molds & Smuts    Shellfish Allergy Other (See Comments)    Unknown    Amoxicillin-Pot Clavulanate Rash   Penicillins Rash       Objective:    BP 127/73   Pulse 78   Ht 6' (1.829 m)   Wt 261 lb 3.2 oz (118.5 kg)   SpO2 98%   BMI 35.43 kg/m  Wt Readings from Last 3 Encounters:  08/20/22 261 lb 3.2 oz (118.5 kg)  07/02/22 266 lb (120.7 kg)  05/23/21 249 lb (112.9 kg)    Physical Exam Vitals and nursing note reviewed.  Constitutional:      Appearance: He is well-developed.  HENT:     Head: Normocephalic and  atraumatic.  Cardiovascular:     Rate and Rhythm: Normal rate and regular rhythm.     Heart sounds: Normal heart sounds. No murmur heard.    No friction rub. No gallop.  Pulmonary:     Effort: Pulmonary effort is normal. No tachypnea or respiratory distress.     Breath sounds: Normal breath sounds. No decreased breath sounds, wheezing, rhonchi or rales.  Chest:     Chest wall: No tenderness.  Abdominal:     General: Bowel sounds are normal.     Palpations: Abdomen is soft.  Musculoskeletal:        General: Normal range of motion.     Cervical back: Normal range of motion.  Skin:    General: Skin is warm and dry.     Findings: Rash present.  Comments: Ill-defined plaques with scale-crust    Neurological:     Mental Status: He is alert and oriented to person, place, and time.     Coordination: Coordination normal.  Psychiatric:        Behavior: Behavior normal. Behavior is cooperative.        Thought Content: Thought content normal.        Judgment: Judgment normal.          Patient has been counseled extensively about nutrition and exercise as well as the importance of adherence with medications and regular follow-up. The patient was given clear instructions to go to ER or return to medical center if symptoms don't improve, worsen or new problems develop. The patient verbalized understanding.   Follow-up: Return if symptoms worsen or fail to improve.   Claiborne Rigg, FNP-BC [facility] [place], [place] [phone]   08/20/2022, 1:55 PM

## 2022-08-20 NOTE — Telephone Encounter (Signed)
Copied from [place] [phone]. Topic: General - Other >> Aug 20, 2022  2:18 PM Everette C wrote: Reason for CRM: Medication Refill - Medication: mometasone (ELOCON) 0.1 % cream [[id number]]  promethazine-dextromethorphan (PROMETHAZINE-DM) 6.25-15 MG/5ML syrup [[id number]]  Has the patient contacted their pharmacy? Yes.  The patient has been directed to contact their PCP  (Agent: If no, request that the patient contact the pharmacy for the refill. If patient does not wish to contact the pharmacy document the reason why and proceed with request.) (Agent: If yes, when and what did the pharmacy advise?)  Preferred Pharmacy (with phone number or street name): [facility] [place], [place] [address] Phone: [phone] Fax: [phone] Hours: Open 24 hours   Has the patient been seen for an appointment in the last year OR does the patient have an upcoming appointment? Yes.    Agent: Please be advised that RX refills may take up to 3 business days. We ask that you follow-up with your pharmacy.
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
## 2022-08-30 ENCOUNTER — Telehealth: Payer: Self-pay | Admitting: Nurse Practitioner

## 2022-08-30 NOTE — Telephone Encounter (Signed)
NO I do not refill promethazine cough syrup. He can continue the elocon until he is seen by dermatology

## 2022-08-30 NOTE — Telephone Encounter (Signed)
Routing to PCP for review.

## 2022-08-30 NOTE — Telephone Encounter (Signed)
Pt is calling to let Zelda know that the promethazine-dextromethorphan (PROMETHAZINE-DM) 6.25-15 MG/5ML syrup [[id number]] ] & mometasone (ELOCON) 0.1 % cream [[id number]]  and it worked.  Pt started with promethazine-dextromethorphan (PROMETHAZINE-DM) 6.25-15 MG/5ML syrup [[id number] it took 24-48 hours to clear up.  Pt is unsure how to speak with Zelda with out a Mychart visit.   Pt is requesting refills until he can get in with dermatology. Or continue with this treatment.   CB- [phone]

## 2022-08-30 NOTE — Telephone Encounter (Signed)
Patient aware and voiced understanding of providers response.

## 2022-09-09 ENCOUNTER — Ambulatory Visit: Payer: Self-pay | Admitting: *Deleted

## 2022-09-09 NOTE — Telephone Encounter (Signed)
  Chief Complaint: medication request:(PROMETHAZINE-DM) 6.25-15 MG/5ML Symptoms: burning sensation in skin Frequency: chronic Pertinent Negatives: Patient denies   Disposition: [] ED /[] Urgent Care (no appt availability in office) / [] Appointment(In office/virtual)/ []  [facility]/ [] Home Care/ [] Refused Recommended Disposition /[]  Mobile Bus/ [x]  Follow-up with PCP Additional Notes: Patient states he has appointment with Dematology in March- he just needs to manage symptoms until he gets there

## 2022-09-09 NOTE — Telephone Encounter (Signed)
Summary: Skin irritation. burning   The patient states the mometasone (ELOCON) 0.1 % cream is not really helping. He wants to speak with his provider about this and says he does not not have an appt with the dermatologist until March. He has still a lot of irritation and burning throughout his body from this rash. He doesn't know what else to do as the provider will not refill the promethazine-dextromethorphan (PROMETHAZINE-DM) 6.25-15 MG/5ML syrup. Please assist patient further.       Patient was in the office with flare- severe. Patient states the medication helped- cream is helping skin surface- but patient feels his internal flare is not over and still going on. Patient is having pain in reaction area- dry cracked. Patient feels that he may need different medication for internal cause. Patient is requesting promethazine-DM- patient feels it was very helpful    Reason for Disposition  Caller requesting a CONTROLLED substance prescription refill (e.g., narcotics, ADHD medicines)  Answer Assessment - Initial Assessment Questions 1. DRUG NAME: "What medicine do you need to have refilled?"     (PROMETHAZINE-DM) 6.25-15 MG/5ML 2. REFILLS REMAINING: "How many refills are remaining?" (Note: The label on the medicine or pill bottle will show how many refills are remaining. If there are no refills remaining, then a renewal may be needed.)      none 3. EXPIRATION DATE: "What is the expiration date?" (Note: The label states when the prescription will expire, and thus can no longer be refilled.)     na 4. PRESCRIBING HCP: "Who prescribed it?" Reason: If prescribed by specialist, call should be referred to that group.     PCP  Patient is requesting systemic treatment- cream seems to be helping outer surface- but patient states the symptoms he has under the skin are still there- burning sensation. Patient states he is not sure what this medication has that turns it off- but it seems to help. Patient does  have Dermatology appointment in March.  Protocols used: Medication Refill and Renewal Call-A-AH

## 2022-09-11 ENCOUNTER — Other Ambulatory Visit: Payer: Self-pay | Admitting: Nurse Practitioner

## 2022-09-11 DIAGNOSIS — J4520 Mild intermittent asthma, uncomplicated: Secondary | ICD-10-CM

## 2022-09-13 ENCOUNTER — Telehealth: Payer: Self-pay | Admitting: Nurse Practitioner

## 2022-09-13 ENCOUNTER — Other Ambulatory Visit: Payer: Self-pay | Admitting: Nurse Practitioner

## 2022-09-13 DIAGNOSIS — L301 Dyshidrosis [pompholyx]: Secondary | ICD-10-CM

## 2022-09-13 MED ORDER — PREDNISONE 10 MG PO TABS: ORAL_TABLET | ORAL | 0 refills | Status: AC

## 2022-09-13 NOTE — Telephone Encounter (Signed)
Pt wanted a refill of promethazine-dextromethorphan (PROMETHAZINE-DM) 6.25-15 MG/5ML syrup [id number]  For his cough/ request was sent from NT on 2.5.23 with no response / please advise    [facility] [id number] - , [place] [address]

## 2022-09-13 NOTE — Telephone Encounter (Signed)
Prednisone sent to pharmacy. I will not refill promethazine

## 2022-09-13 NOTE — Telephone Encounter (Signed)
Patient identified by name and date of birth. Patient aware of response from provider. Patient stated that prednisone does not work for him. I proceeded to respond and the patient hung up the phone.

## 2022-12-24 ENCOUNTER — Emergency Department (HOSPITAL_BASED_OUTPATIENT_CLINIC_OR_DEPARTMENT_OTHER)
Admission: EM | Admit: 2022-12-24 | Discharge: 2022-12-24 | Disposition: A | Discharge status: Home / Self Care | Payer: Medicaid Other | Attending: Emergency Medicine | Admitting: Emergency Medicine

## 2022-12-24 ENCOUNTER — Other Ambulatory Visit: Payer: Self-pay

## 2022-12-24 DIAGNOSIS — R0981 Nasal congestion: Secondary | ICD-10-CM | POA: Insufficient documentation

## 2022-12-24 DIAGNOSIS — R059 Cough, unspecified: Secondary | ICD-10-CM | POA: Diagnosis present

## 2022-12-24 DIAGNOSIS — J209 Acute bronchitis, unspecified: Secondary | ICD-10-CM

## 2022-12-24 MED ORDER — AZITHROMYCIN 250 MG PO TABS: 250.0000 mg | ORAL_TABLET | Freq: Every day | ORAL | 0 refills | Status: DC

## 2022-12-24 MED ORDER — AZITHROMYCIN 250 MG PO TABS
500.0000 mg | ORAL_TABLET | Freq: Once | ORAL | Status: AC
  Administered 2022-12-24: 500 mg via ORAL
  Filled 2022-12-24: qty 2

## 2022-12-24 MED ORDER — AZITHROMYCIN 250 MG PO TABS: 250.0000 mg | ORAL_TABLET | Freq: Every day | ORAL | 0 refills | Status: AC

## 2022-12-24 MED ORDER — ALBUTEROL SULFATE HFA 108 (90 BASE) MCG/ACT IN AERS
2.0000 | INHALATION_SPRAY | Freq: Once | RESPIRATORY_TRACT | Status: AC
  Administered 2022-12-24: 2 via RESPIRATORY_TRACT
  Filled 2022-12-24: qty 6.7

## 2022-12-24 NOTE — ED Triage Notes (Addendum)
Pt says he lost his voice last week. Has had cough, with productive green mucous. Pt does not have a current rx of albuterol inhaler

## 2022-12-24 NOTE — Discharge Instructions (Signed)
Begin taking Zithromax as prescribed.  Use the albuterol inhaler, 2 puffs every 4 hours as needed for wheezing or difficulty breathing.  Continue over the counter medications as needed for symptom relief.  Follow-up with primary doctor if not improving in the next few days.

## 2022-12-24 NOTE — ED Provider Notes (Signed)
[place] EMERGENCY DEPARTMENT AT [facility] Provider Note   CSN: [id number] Arrival date & time: 12/24/22  0315     History  No chief complaint on file.   Phillip Leach is a 27 y.o. male.  Patient is a 27 year old male with history of asthma.  Patient presenting today with complaints of chest congestion and cough worsening over the past 10 days.  He describes coughing up thick, green mucus.  He also feels occasionally short of breath.  No chest pain, no fever.    The history is provided by the patient.       Home Medications Prior to Admission medications   Medication Sig Start Date End Date Taking? Authorizing Provider  albuterol (VENTOLIN HFA) 108 (90 Base) MCG/ACT inhaler TAKE 2 PUFFS BY MOUTH EVERY 6 HOURS AS NEEDED FOR WHEEZE OR SHORTNESS OF BREATH 09/11/22  Yes Newlin, Enobong, MD  EPINEPHrine (EPIPEN 2-PAK) 0.3 mg/0.3 mL IJ SOAJ injection Inject 0.3 mg into the muscle as needed for anaphylaxis. 08/20/22  Yes Claiborne Rigg, NP  mometasone (ELOCON) 0.1 % cream Apply topically daily. 08/20/22   Claiborne Rigg, NP  predniSONE (DELTASONE) 10 MG tablet Take 4 tablets a day with food for 5 days, 3 tabs a day for 4 days, 2 tabs a day for 4 days, 1 tab a day for 4 days, 1/2 tab a day for 4 day 09/13/22   Claiborne Rigg, NP      Allergies    Minocin [minocycline hcl], Molds & smuts, Shellfish allergy, Amoxicillin-pot clavulanate, and Penicillins    Review of Systems   Review of Systems  All other systems reviewed and are negative.   Physical Exam Updated Vital Signs BP (!) 151/97   Pulse 81   Temp 98 F (36.7 C) (Oral)   Resp 18   Ht 6' (1.829 m)   Wt 95.3 kg   SpO2 99%   BMI 28.48 kg/m  Physical Exam Vitals and nursing note reviewed.  Constitutional:      General: He is not in acute distress.    Appearance: He is well-developed. He is not diaphoretic.  HENT:     Head: Normocephalic and atraumatic.     Mouth/Throat:     Mouth: Mucous membranes are  moist.     Pharynx: No oropharyngeal exudate or posterior oropharyngeal erythema.  Cardiovascular:     Rate and Rhythm: Normal rate and regular rhythm.     Heart sounds: No murmur heard.    No friction rub.  Pulmonary:     Effort: Pulmonary effort is normal. No respiratory distress.     Breath sounds: Normal breath sounds. No wheezing or rales.  Abdominal:     General: Bowel sounds are normal. There is no distension.     Palpations: Abdomen is soft.     Tenderness: There is no abdominal tenderness.  Musculoskeletal:        General: Normal range of motion.     Cervical back: Normal range of motion and neck supple.  Skin:    General: Skin is warm and dry.  Neurological:     Mental Status: He is alert and oriented to person, place, and time.     Coordination: Coordination normal.     ED Results / Procedures / Treatments   Labs (all labs ordered are listed, but only abnormal results are displayed) Labs Reviewed - No data to display  EKG None  Radiology No results found.  Procedures Procedures  Medications Ordered in ED Medications  azithromycin (ZITHROMAX) tablet 500 mg (has no administration in time range)  albuterol (VENTOLIN HFA) 108 (90 Base) MCG/ACT inhaler 2 puff (has no administration in time range)    ED Course/ Medical Decision Making/ A&P  Patient is a 27 year old male presenting with URI symptoms as described in the HPI.  He arrives here with stable vital signs, no fever, no hypoxia.  Physical examination basically unremarkable.  Patient will be prescribed antibiotics due to the duration of his symptoms and given an albuterol inhaler.  He is to continue over-the-counter medications as needed.  Final Clinical Impression(s) / ED Diagnoses Final diagnoses:  None    Rx / DC Orders ED Discharge Orders     None         Geoffery Lyons, MD 12/24/22 [phone]

## 2023-08-14 ENCOUNTER — Ambulatory Visit (HOSPITAL_COMMUNITY)
Admission: EM | Admit: 2023-08-14 | Discharge: 2023-08-14 | Disposition: A | Discharge status: Home / Self Care | Payer: Medicaid Other | Attending: Internal Medicine | Admitting: Internal Medicine

## 2023-08-14 ENCOUNTER — Encounter (HOSPITAL_COMMUNITY): Payer: Self-pay | Admitting: Emergency Medicine

## 2023-08-14 ENCOUNTER — Other Ambulatory Visit: Payer: Self-pay

## 2023-08-14 DIAGNOSIS — B349 Viral infection, unspecified: Secondary | ICD-10-CM | POA: Insufficient documentation

## 2023-08-14 DIAGNOSIS — R1030 Lower abdominal pain, unspecified: Secondary | ICD-10-CM | POA: Insufficient documentation

## 2023-08-14 LAB — CBC WITH DIFFERENTIAL/PLATELET
Abs Immature Granulocytes: 0.02 10*3/uL (ref 0.00–0.07)
Basophils Absolute: 0 10*3/uL (ref 0.0–0.1)
Basophils Relative: 0 %
Eosinophils Absolute: 0.2 10*3/uL (ref 0.0–0.5)
Eosinophils Relative: 4 %
HCT: 49.5 % (ref 39.0–52.0)
Hemoglobin: 15.9 g/dL (ref 13.0–17.0)
Immature Granulocytes: 0 %
Lymphocytes Relative: 8 %
Lymphs Abs: 0.5 10*3/uL — ABNORMAL LOW (ref 0.7–4.0)
MCH: 28.1 pg (ref 26.0–34.0)
MCHC: 32.1 g/dL (ref 30.0–36.0)
MCV: 87.5 fL (ref 80.0–100.0)
Monocytes Absolute: 0.4 10*3/uL (ref 0.1–1.0)
Monocytes Relative: 7 %
Neutro Abs: 4.9 10*3/uL (ref 1.7–7.7)
Neutrophils Relative %: 81 %
Platelets: 186 10*3/uL (ref 150–400)
RBC: 5.66 MIL/uL (ref 4.22–5.81)
RDW: 13 % (ref 11.5–15.5)
WBC: 6.1 10*3/uL (ref 4.0–10.5)
nRBC: 0 % (ref 0.0–0.2)

## 2023-08-14 LAB — POCT URINALYSIS DIP (MANUAL ENTRY)
Blood, UA: NEGATIVE
Glucose, UA: NEGATIVE mg/dL
Leukocytes, UA: NEGATIVE
Nitrite, UA: NEGATIVE
Spec Grav, UA: 1.025
Urobilinogen, UA: 1 U/dL
pH, UA: 6.5

## 2023-08-14 LAB — BASIC METABOLIC PANEL
Anion gap: 9 (ref 5–15)
BUN: 9 mg/dL (ref 6–20)
CO2: 25 mmol/L (ref 22–32)
Calcium: 9.3 mg/dL (ref 8.9–10.3)
Chloride: 105 mmol/L (ref 98–111)
Creatinine, Ser: 1.19 mg/dL (ref 0.61–1.24)
GFR, Estimated: >60 mL/min (ref >60)
Glucose, Bld: 88 mg/dL (ref 70–99)
Potassium: 4.2 mmol/L (ref 3.5–5.1)
Sodium: 139 mmol/L (ref 135–145)

## 2023-08-14 LAB — POCT INFLUENZA A/B
Influenza A, POC: NEGATIVE
Influenza B, POC: NEGATIVE

## 2023-08-14 LAB — VITAMIN D 25 HYDROXY (VIT D DEFICIENCY, FRACTURES): Vit D, 25-Hydroxy: 10.9 ng/mL — ABNORMAL LOW (ref 30–100)

## 2023-08-14 NOTE — ED Notes (Signed)
 Patient has multiple complaints.  Patient also complains about left arm and fingers hurt and has weakness that impairs ability to open water bottles.  Patient reports he did not make an appt with pcp, because it takes too long to be seen.  Patient is reporting multiple issues of multiple and varied duration

## 2023-08-14 NOTE — Discharge Instructions (Addendum)
 Please maintain adequate hydration Your flu test is negative for flu A or B Your urine studies are negative for urine infection. Will call you with recommendations if labs abnormal If you continue to have abdominal pain, please go to the emergency department for further evaluation.

## 2023-08-14 NOTE — ED Triage Notes (Signed)
 Stomach pain started around 12 noon.  Feeling feverish, weakness.  Vomited once today.  No diarrhea today.  Last bm was yesterday and considers stool normal.  Has not had any medications for symptoms
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
## 2023-08-15 ENCOUNTER — Telehealth (HOSPITAL_BASED_OUTPATIENT_CLINIC_OR_DEPARTMENT_OTHER): Payer: Self-pay

## 2023-08-15 LAB — SARS CORONAVIRUS 2 (TAT 6-24 HRS): SARS Coronavirus 2: NEGATIVE

## 2023-08-15 MED ORDER — ERGOCALCIFEROL 1.25 MG (50000 UT) PO CAPS: 50000.0000 [IU] | ORAL_CAPSULE | ORAL | 0 refills | Status: AC

## 2023-08-15 NOTE — Telephone Encounter (Signed)
 Attempted to reach patient x1. LVM Per protocol, pt to start Vitamin D2 50,000 units weekly x6 weeks, then begin OTC Vitamin D2 800 units daily. Rx sent to pharmacy on file.

## 2023-11-12 ENCOUNTER — Emergency Department (HOSPITAL_BASED_OUTPATIENT_CLINIC_OR_DEPARTMENT_OTHER)
Admission: EM | Admit: 2023-11-12 | Discharge: 2023-11-12 | Discharge status: Against Medical Advice | Attending: Emergency Medicine | Admitting: Emergency Medicine

## 2023-11-12 ENCOUNTER — Other Ambulatory Visit: Payer: Self-pay

## 2023-11-12 DIAGNOSIS — Z5321 Procedure and treatment not carried out due to patient leaving prior to being seen by health care provider: Secondary | ICD-10-CM | POA: Insufficient documentation

## 2023-11-12 DIAGNOSIS — R2241 Localized swelling, mass and lump, right lower limb: Secondary | ICD-10-CM | POA: Diagnosis present

## 2023-11-12 MED ORDER — OXYCODONE-ACETAMINOPHEN 5-325 MG PO TABS
1.0000 | ORAL_TABLET | ORAL | Status: DC | PRN
  Administered 2023-11-12: 1 via ORAL
  Filled 2023-11-12: qty 1

## 2023-11-12 NOTE — ED Notes (Signed)
 No answer x2

## 2023-11-12 NOTE — ED Notes (Signed)
 Pt did not answer times 1

## 2023-11-12 NOTE — ED Notes (Signed)
 Pt did not answer when called by staff times 3.

## 2023-11-12 NOTE — ED Triage Notes (Signed)
 Diagnosed with inguinal hernia at [facility]. Urinalysis and physical assessment completed. Swelling in right sided groin swelling. Denies genital swelling.

## 2024-01-20 ENCOUNTER — Encounter (HOSPITAL_BASED_OUTPATIENT_CLINIC_OR_DEPARTMENT_OTHER): Payer: Self-pay | Admitting: Emergency Medicine

## 2024-01-20 ENCOUNTER — Emergency Department (HOSPITAL_BASED_OUTPATIENT_CLINIC_OR_DEPARTMENT_OTHER)
Admission: EM | Admit: 2024-01-20 | Discharge: 2024-01-20 | Disposition: A | Discharge status: Home / Self Care | Attending: Emergency Medicine | Admitting: Emergency Medicine

## 2024-01-20 ENCOUNTER — Emergency Department (HOSPITAL_BASED_OUTPATIENT_CLINIC_OR_DEPARTMENT_OTHER): Admitting: Radiology

## 2024-01-20 ENCOUNTER — Other Ambulatory Visit: Payer: Self-pay

## 2024-01-20 DIAGNOSIS — R21 Rash and other nonspecific skin eruption: Secondary | ICD-10-CM | POA: Insufficient documentation

## 2024-01-20 DIAGNOSIS — I10 Essential (primary) hypertension: Secondary | ICD-10-CM | POA: Insufficient documentation

## 2024-01-20 DIAGNOSIS — R053 Chronic cough: Secondary | ICD-10-CM | POA: Diagnosis not present

## 2024-01-20 LAB — COMPREHENSIVE METABOLIC PANEL WITH GFR
ALT: 34 U/L (ref 0–44)
AST: 29 U/L (ref 15–41)
Albumin: 4.5 g/dL (ref 3.5–5.0)
Alkaline Phosphatase: 95 U/L (ref 38–126)
Anion gap: 14 (ref 5–15)
BUN: 10 mg/dL (ref 6–20)
CO2: 22 mmol/L (ref 22–32)
Calcium: 10.3 mg/dL (ref 8.9–10.3)
Chloride: 105 mmol/L (ref 98–111)
Creatinine, Ser: 1.11 mg/dL (ref 0.61–1.24)
GFR, Estimated: >60 mL/min (ref >60)
Glucose, Bld: 91 mg/dL (ref 70–99)
Potassium: 4.1 mmol/L (ref 3.5–5.1)
Sodium: 140 mmol/L (ref 135–145)
Total Bilirubin: 0.4 mg/dL (ref 0.0–1.2)
Total Protein: 7.5 g/dL (ref 6.5–8.1)

## 2024-01-20 LAB — CBC WITH DIFFERENTIAL/PLATELET
Abs Immature Granulocytes: 0.03 10*3/uL (ref 0.00–0.07)
Basophils Absolute: 0 10*3/uL (ref 0.0–0.1)
Basophils Relative: 1 %
Eosinophils Absolute: 0.2 10*3/uL (ref 0.0–0.5)
Eosinophils Relative: 3 %
HCT: 45 % (ref 39.0–52.0)
Hemoglobin: 14.9 g/dL (ref 13.0–17.0)
Immature Granulocytes: 0 %
Lymphocytes Relative: 43 %
Lymphs Abs: 3.2 10*3/uL (ref 0.7–4.0)
MCH: 28.4 pg (ref 26.0–34.0)
MCHC: 33.1 g/dL (ref 30.0–36.0)
MCV: 85.9 fL (ref 80.0–100.0)
Monocytes Absolute: 0.6 10*3/uL (ref 0.1–1.0)
Monocytes Relative: 8 %
Neutro Abs: 3.4 10*3/uL (ref 1.7–7.7)
Neutrophils Relative %: 45 %
Platelets: 222 10*3/uL (ref 150–400)
RBC: 5.24 MIL/uL (ref 4.22–5.81)
RDW: 13.1 % (ref 11.5–15.5)
WBC: 7.5 10*3/uL (ref 4.0–10.5)
nRBC: 0 % (ref 0.0–0.2)

## 2024-01-20 MED ORDER — DIPHENHYDRAMINE HCL 50 MG/ML IJ SOLN
25.0000 mg | Freq: Once | INTRAMUSCULAR | Status: AC
  Administered 2024-01-20: 25 mg via INTRAVENOUS
  Filled 2024-01-20: qty 1

## 2024-01-20 MED ORDER — BETAMETHASONE VALERATE 0.1 % EX OINT: 1.0000 | TOPICAL_OINTMENT | Freq: Two times a day (BID) | CUTANEOUS | 0 refills | Status: AC

## 2024-01-20 MED ORDER — EPINEPHRINE 0.3 MG/0.3ML IJ SOAJ: 0.3000 mg | INTRAMUSCULAR | 0 refills | Status: AC | PRN

## 2024-01-20 MED ORDER — ACETAMINOPHEN 500 MG PO TABS
1000.0000 mg | ORAL_TABLET | Freq: Once | ORAL | Status: AC
  Administered 2024-01-20: 1000 mg via ORAL
  Filled 2024-01-20: qty 2

## 2024-01-20 MED ORDER — DEXAMETHASONE SODIUM PHOSPHATE 10 MG/ML IJ SOLN
10.0000 mg | Freq: Once | INTRAMUSCULAR | Status: AC
  Administered 2024-01-20: 10 mg via INTRAVENOUS
  Filled 2024-01-20: qty 1

## 2024-01-20 NOTE — ED Provider Notes (Signed)
 [place] EMERGENCY DEPARTMENT AT [facility] Provider Note   CSN: [id number] Arrival date & time: 01/20/24  1343     Patient presents with: Rash   Phillip Leach is a 28 y.o. male.  With past medical history of allergic rhinitis, hypertension, patient allergy, eczema is reporting to emergency room with complaint of rash.  Patient reports for several months he has had intermittent rash on the dorsal aspect of bilateral hands that is itchy and burning.  He thinks it is getting worse.  He has tried oral steroids but once he is off oral steroids symptoms improved.  He has not used anything today.  He has noted cough but denies any wheezing shortness of breath chest pain abdominal pain or other associated symptoms.  He has not had fever.  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 Want refill of EpiPen .     Rash      Prior to Admission medications   Medication Sig Start Date End Date Taking? Authorizing Provider  betamethasone valerate ointment (VALISONE) 0.1 % Apply 1 Application topically 2 (two) times daily. 01/20/24  Yes Estefana Taylor N, PA-C  albuterol  (VENTOLIN  HFA) 108 (90 Base) MCG/ACT inhaler TAKE 2 PUFFS BY MOUTH EVERY 6 HOURS AS NEEDED FOR WHEEZE OR SHORTNESS OF BREATH Patient not taking: Reported on 08/14/2023 09/11/22   Newlin, Enobong, MD  azithromycin  (ZITHROMAX ) 250 MG tablet Take 1 tablet (250 mg total) by mouth daily. Patient not taking: Reported on 08/14/2023 12/24/22   Orvilla Blander, MD  EPINEPHrine  (EPIPEN  2-PAK) 0.3 mg/0.3 mL IJ SOAJ injection Inject 0.3 mg into the muscle as needed for anaphylaxis. 08/20/22   Fleming, Zelda W, NP  mometasone  (ELOCON ) 0.1 % cream Apply topically daily. Patient not taking: Reported on 08/14/2023 08/20/22   Fleming, Zelda W, NP  predniSONE  (DELTASONE ) 10 MG tablet Take 4 tablets a day with food for 5 days, 3 tabs a day  for 4 days, 2 tabs a day for 4 days, 1 tab a day for 4 days, 1/2 tab a day for 4 day Patient not taking: Reported on 08/14/2023 09/13/22   Collins Dean, NP    Allergies: Minocin [minocycline hcl], Molds & smuts, Shellfish allergy, Amoxicillin-pot clavulanate, and Penicillins    Review of Systems  Skin:  Positive for rash.    Updated Vital Signs BP 137/88 (BP Location: Left Arm)   Pulse (!) 107   Temp 98.6 F (37 C)   Resp 16   Ht 6' (1.829 m)   Wt 113.4 kg   SpO2 100%   BMI 33.91 kg/m   Physical Exam Vitals and nursing note reviewed.  Constitutional:      General: He is not in acute distress.    Appearance: He is not toxic-appearing.  HENT:     Head: Normocephalic and atraumatic.   Eyes:     General: No scleral icterus.    Conjunctiva/sclera: Conjunctivae normal.    Cardiovascular:     Rate and Rhythm: Normal rate and regular rhythm.     Pulses: Normal pulses.     Heart sounds: Normal heart sounds.  Pulmonary:     Effort: Pulmonary effort is normal. No respiratory distress.     Breath sounds: Normal breath sounds.  Abdominal:     General: Abdomen is flat. Bowel sounds are normal.     Palpations: Abdomen is  soft.     Tenderness: There is no abdominal tenderness.   Skin:    General: Skin is warm and dry.     Findings: No lesion.     Comments: Dorsal hand/finger rash, small itchy pustules with cracked dry skin. Seems consistent with eczema. No abscess or cellulitis.    Neurological:     General: No focal deficit present.     Mental Status: He is alert and oriented to person, place, and time. Mental status is at baseline.     (all labs ordered are listed, but only abnormal results are displayed) Labs Reviewed  CBC WITH DIFFERENTIAL/PLATELET  COMPREHENSIVE METABOLIC PANEL WITH GFR    EKG: None  Radiology: No results found.   Procedures   Medications Ordered in the ED  dexamethasone  (DECADRON ) injection 10 mg (10 mg Intravenous Given 01/20/24 1546)   diphenhydrAMINE  (BENADRYL ) injection 25 mg (25 mg Intravenous Given 01/20/24 1546)  acetaminophen  (TYLENOL ) tablet 1,000 mg (1,000 mg Oral Given 01/20/24 1546)                                    Medical Decision Making Amount and/or Complexity of Data Reviewed Labs: ordered. Radiology: ordered.  Risk OTC drugs. Prescription drug management.   This patient presents to the ED for concern of rash/cough, this involves an extensive number of treatment options, and is a complaint that carries with it a high risk of complications and morbidity.  The differential diagnosis includes URI, chronic cough, pneumonia, cellulitis, dermatitis, SJS     Lab Tests:  I personally interpreted labs.  The pertinent results include:   CBC and CMP within normal limits   Imaging Studies ordered:  I ordered imaging studies including chest x-ray   I independently visualized and interpreted imaging which showed no acute findings.  I agree with the radiologist interpretation   Problem List / ED Course / Critical interventions / Medication management  Reporting to emergency room with complaint of rash that has been intermittent bothering him for some months now.  He does have history of eczema.  He has tried topical steroids and oral steroids but rash continues to come back.  Notes most recently he has been trying Benadryl .  He has no sign of systemic illness, no fever.  On exam he has no sign of overlying cellulitis.  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  Will send topical betamethasone cream to use twice daily for 7 days.  He is already established with dermatology and will follow-up with them.  Also notes cough that has been ongoing for several months this is not associated with chest pain shortness of breath or wheezing.  Will chest chest x-ray.  Reviewed medications.  Will refill EpiPen  and refer to allergy specialist in regards to prior concern of anaphylaxis... Today patient's  symptoms are not consistent with anaphylaxis.  Overall very well-appearing.  Report for outpatient follow-up I ordered medication including Benadryl , Decadron , Tylenol  Reevaluation of the patient after these medicines showed that the patient improved I have reviewed the patients home medicines and have made adjustments as needed   Plan F/u w/ PCP in 2-3d to ensure resolution of sx.  Patient was given return precautions. Patient stable for discharge at this time.  Patient educated on sx/dx and verbalized understanding of plan. Return to ER w/ new or worsening sx.       Final diagnoses:  Rash  Chronic cough    ED Discharge Orders          Ordered    betamethasone valerate ointment (VALISONE) 0.1 %  2 times daily        01/20/24 1539               Heran Campau, Kandace Organ, PA-C 01/20/24 1801    Tonya Fredrickson, MD 01/21/24 1049
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
## 2024-01-20 NOTE — Discharge Instructions (Addendum)
 Please follow-up with allergy specialist in regards to prior history of anaphylaxis with unknown allergen.  Follow up with dermatology for rash.  I feel this rash is most likely consistent with dyshidrotic eczema.  Have sent steroid cream please use twice daily.

## 2024-01-20 NOTE — ED Triage Notes (Signed)
 Rash  Seen and dx contact derm X 1 month Worsening rash on left ahand and arm  Patient has been doing more research and thinks its mast cell reaction. Took an epi pen 2 weeks ago and he felt better.

## 2024-04-04 ENCOUNTER — Encounter (HOSPITAL_BASED_OUTPATIENT_CLINIC_OR_DEPARTMENT_OTHER): Payer: Self-pay

## 2024-04-04 ENCOUNTER — Emergency Department (HOSPITAL_BASED_OUTPATIENT_CLINIC_OR_DEPARTMENT_OTHER)
Admission: EM | Admit: 2024-04-04 | Discharge: 2024-04-04 | Disposition: A | Discharge status: Home / Self Care | Attending: Emergency Medicine | Admitting: Emergency Medicine

## 2024-04-04 ENCOUNTER — Other Ambulatory Visit: Payer: Self-pay

## 2024-04-04 ENCOUNTER — Emergency Department (HOSPITAL_BASED_OUTPATIENT_CLINIC_OR_DEPARTMENT_OTHER)

## 2024-04-04 DIAGNOSIS — X501XXA Overexertion from prolonged static or awkward postures, initial encounter: Secondary | ICD-10-CM | POA: Insufficient documentation

## 2024-04-04 DIAGNOSIS — S93602A Unspecified sprain of left foot, initial encounter: Secondary | ICD-10-CM | POA: Insufficient documentation

## 2024-04-04 MED ORDER — ACETAMINOPHEN 500 MG PO TABS
1000.0000 mg | ORAL_TABLET | Freq: Once | ORAL | Status: AC
  Administered 2024-04-04: 1000 mg via ORAL
  Filled 2024-04-04: qty 2

## 2024-04-04 MED ORDER — IBUPROFEN 800 MG PO TABS
800.0000 mg | ORAL_TABLET | Freq: Once | ORAL | Status: AC
  Administered 2024-04-04: 800 mg via ORAL
  Filled 2024-04-04: qty 1

## 2024-04-04 NOTE — ED Triage Notes (Signed)
 Stepped off porch and rolled L foot at 1200 8/30. Heard a crack. Slight swelling. Hard to bear weight.

## 2024-04-04 NOTE — ED Provider Notes (Signed)
 [place] EMERGENCY DEPARTMENT AT [facility] Provider Note   CSN: [id number] Arrival date & time: 04/04/24  9792     Patient presents with: Foot Injury   Phillip Leach is a 28 y.o. male.   Presents for evaluation of left foot and ankle injury.  Patient reports rolling his ankle after he stepped off a porch.       Prior to Admission medications   Medication Sig Start Date End Date Taking? Authorizing Provider  albuterol  (VENTOLIN  HFA) 108 (90 Base) MCG/ACT inhaler TAKE 2 PUFFS BY MOUTH EVERY 6 HOURS AS NEEDED FOR WHEEZE OR SHORTNESS OF BREATH Patient not taking: Reported on 08/14/2023 09/11/22   Newlin, Enobong, MD  azithromycin  (ZITHROMAX ) 250 MG tablet Take 1 tablet (250 mg total) by mouth daily. Patient not taking: Reported on 08/14/2023 12/24/22   Geroldine Berg, MD  betamethasone  valerate ointment (VALISONE ) 0.1 % Apply 1 Application topically 2 (two) times daily. 01/20/24   Barrett, Warren SAILOR, PA-C  EPINEPHrine  0.3 mg/0.3 mL IJ SOAJ injection Inject 0.3 mg into the muscle as needed for anaphylaxis. 01/20/24   Barrett, Jamie N, PA-C  mometasone  (ELOCON ) 0.1 % cream Apply topically daily. Patient not taking: Reported on 08/14/2023 08/20/22   Fleming, Zelda W, NP  predniSONE  (DELTASONE ) 10 MG tablet Take 4 tablets a day with food for 5 days, 3 tabs a day for 4 days, 2 tabs a day for 4 days, 1 tab a day for 4 days, 1/2 tab a day for 4 day Patient not taking: Reported on 08/14/2023 09/13/22   Theotis Haze ORN, NP    Allergies: Minocin [minocycline hcl], Molds & smuts, Shellfish allergy, Amoxicillin-pot clavulanate, and Penicillins    Review of Systems  Updated Vital Signs BP 133/77   Pulse 70   SpO2 99%   Physical Exam Vitals and nursing note reviewed.  Constitutional:      Appearance: Normal appearance.  HENT:     Head: Atraumatic.  Musculoskeletal:     Left ankle: Swelling present. No deformity or ecchymosis. Tenderness present. Decreased range of motion.     Left  Achilles Tendon: Normal.     Left foot: Decreased range of motion. Swelling and tenderness present. No deformity.       Legs:  Skin:    Findings: No ecchymosis.  Neurological:     Mental Status: He is alert.     Sensory: Sensation is intact.     Motor: Motor function is intact.     (all labs ordered are listed, but only abnormal results are displayed) Labs Reviewed - No data to display  EKG: None  Radiology: DG Foot Complete Left Result Date: 04/04/2024 CLINICAL DATA:  Initial evaluation for acute pain status post injury. EXAM: LEFT FOOT - COMPLETE 3+ VIEW COMPARISON:  None Available. FINDINGS: There is no evidence of fracture or dislocation. There is no evidence of arthropathy or other focal bone abnormality. Soft tissues are unremarkable. IMPRESSION: No acute osseous abnormality about the foot. Electronically Signed   By: Morene Hoard M.D.   On: 04/04/2024 02:52   DG Ankle Complete Left Result Date: 04/04/2024 CLINICAL DATA:  Initial evaluation for acute pain status post injury. EXAM: LEFT ANKLE COMPLETE - 3+ VIEW COMPARISON:  None Available. FINDINGS: There is no evidence of fracture, dislocation, or joint effusion. There is no evidence of arthropathy or other focal bone abnormality. Mild diffuse soft tissue swelling about the ankle. IMPRESSION: 1. No acute osseous abnormality about the ankle. 2. Mild diffuse soft tissue  swelling. Electronically Signed   By: Morene Hoard M.D.   On: 04/04/2024 02:50     Procedures   Medications Ordered in the ED  ibuprofen  (ADVIL ) tablet 800 mg (800 mg Oral Given 04/04/24 0319)  acetaminophen  (TYLENOL ) tablet 1,000 mg (1,000 mg Oral Given 04/04/24 0319)                                    Medical Decision Making Amount and/or Complexity of Data Reviewed Radiology: ordered.  Risk OTC drugs. Prescription drug management.   Presents with left foot and ankle pain after mis-stepping and rolling his ankle.  X-ray of foot and  ankle negative.  No other injury.     Final diagnoses:  Foot sprain, left, initial encounter    ED Discharge Orders     None          Khalid Lacko, Lonni PARAS, MD 04/04/24 [phone]

## 2024-08-07 ENCOUNTER — Encounter (HOSPITAL_COMMUNITY): Payer: Self-pay

## 2024-08-07 ENCOUNTER — Emergency Department (HOSPITAL_COMMUNITY)
Admission: EM | Admit: 2024-08-07 | Discharge: 2024-08-07 | Discharge status: Against Medical Advice | Attending: Emergency Medicine | Admitting: Emergency Medicine

## 2024-08-07 ENCOUNTER — Other Ambulatory Visit: Payer: Self-pay

## 2024-08-07 DIAGNOSIS — Z5321 Procedure and treatment not carried out due to patient leaving prior to being seen by health care provider: Secondary | ICD-10-CM | POA: Insufficient documentation

## 2024-08-07 DIAGNOSIS — R1032 Left lower quadrant pain: Secondary | ICD-10-CM | POA: Insufficient documentation

## 2024-08-07 LAB — COMPREHENSIVE METABOLIC PANEL WITH GFR
ALT: 37 U/L (ref 0–44)
AST: 29 U/L (ref 15–41)
Albumin: 4.1 g/dL (ref 3.5–5.0)
Alkaline Phosphatase: 85 U/L (ref 38–126)
Anion gap: 12 (ref 5–15)
BUN: 13 mg/dL (ref 6–20)
CO2: 24 mmol/L (ref 22–32)
Calcium: 9 mg/dL (ref 8.9–10.3)
Chloride: 104 mmol/L (ref 98–111)
Creatinine, Ser: 1.16 mg/dL (ref 0.61–1.24)
GFR, Estimated: >60 mL/min
Glucose, Bld: 118 mg/dL — ABNORMAL HIGH (ref 70–99)
Potassium: 3.6 mmol/L (ref 3.5–5.1)
Sodium: 140 mmol/L (ref 135–145)
Total Bilirubin: 0.6 mg/dL (ref 0.0–1.2)
Total Protein: 6.4 g/dL — ABNORMAL LOW (ref 6.5–8.1)

## 2024-08-07 LAB — URINALYSIS, ROUTINE W REFLEX MICROSCOPIC
Bilirubin Urine: NEGATIVE
Glucose, UA: NEGATIVE mg/dL
Hgb urine dipstick: NEGATIVE
Ketones, ur: NEGATIVE mg/dL
Leukocytes,Ua: NEGATIVE
Nitrite: NEGATIVE
Protein, ur: NEGATIVE mg/dL
Specific Gravity, Urine: 1.026 (ref 1.005–1.030)
pH: 5 (ref 5.0–8.0)

## 2024-08-07 LAB — CBC
HCT: 43.6 % (ref 39.0–52.0)
Hemoglobin: 14 g/dL (ref 13.0–17.0)
MCH: 28.7 pg (ref 26.0–34.0)
MCHC: 32.1 g/dL (ref 30.0–36.0)
MCV: 89.3 fL (ref 80.0–100.0)
Platelets: 208 K/uL (ref 150–400)
RBC: 4.88 MIL/uL (ref 4.22–5.81)
RDW: 13.3 % (ref 11.5–15.5)
WBC: 8 K/uL (ref 4.0–10.5)
nRBC: 0 % (ref 0.0–0.2)

## 2024-08-07 LAB — LIPASE, BLOOD: Lipase: 34 U/L (ref 11–51)

## 2024-08-07 NOTE — ED Triage Notes (Signed)
 Patient states he has groin pain on the left side for a month that has progressed to left leg pain. Patient states he has been losing sleep and collapsing because something is not right in his body.

## 2024-08-07 NOTE — ED Notes (Signed)
Pt called for room x3 no response 

## 2024-08-12 ENCOUNTER — Emergency Department (HOSPITAL_BASED_OUTPATIENT_CLINIC_OR_DEPARTMENT_OTHER)
Admission: EM | Admit: 2024-08-12 | Discharge: 2024-08-12 | Disposition: A | Discharge status: Home / Self Care | Attending: Emergency Medicine | Admitting: Emergency Medicine

## 2024-08-12 ENCOUNTER — Other Ambulatory Visit: Payer: Self-pay

## 2024-08-12 ENCOUNTER — Encounter (HOSPITAL_BASED_OUTPATIENT_CLINIC_OR_DEPARTMENT_OTHER): Payer: Self-pay

## 2024-08-12 ENCOUNTER — Emergency Department (HOSPITAL_BASED_OUTPATIENT_CLINIC_OR_DEPARTMENT_OTHER)

## 2024-08-12 DIAGNOSIS — Z79899 Other long term (current) drug therapy: Secondary | ICD-10-CM | POA: Diagnosis not present

## 2024-08-12 DIAGNOSIS — R1030 Lower abdominal pain, unspecified: Secondary | ICD-10-CM | POA: Diagnosis not present

## 2024-08-12 DIAGNOSIS — D72829 Elevated white blood cell count, unspecified: Secondary | ICD-10-CM | POA: Diagnosis not present

## 2024-08-12 DIAGNOSIS — R300 Dysuria: Secondary | ICD-10-CM | POA: Diagnosis present

## 2024-08-12 LAB — URINALYSIS, ROUTINE W REFLEX MICROSCOPIC
Bacteria, UA: NONE SEEN
Bilirubin Urine: NEGATIVE
Glucose, UA: NEGATIVE mg/dL
Ketones, ur: NEGATIVE mg/dL
Nitrite: NEGATIVE
Protein, ur: NEGATIVE mg/dL
Specific Gravity, Urine: 1.01 (ref 1.005–1.030)
pH: 6 (ref 5.0–8.0)

## 2024-08-12 MED ORDER — SULFAMETHOXAZOLE-TRIMETHOPRIM 800-160 MG PO TABS
1.0000 | ORAL_TABLET | Freq: Once | ORAL | Status: AC
  Administered 2024-08-12: 1 via ORAL
  Filled 2024-08-12: qty 1

## 2024-08-12 MED ORDER — SULFAMETHOXAZOLE-TRIMETHOPRIM 800-160 MG PO TABS: 1.0000 | ORAL_TABLET | Freq: Two times a day (BID) | ORAL | 0 refills | Status: AC

## 2024-08-12 NOTE — ED Triage Notes (Signed)
 Presents to ED with c/o L sided groin pain for one month. States it's been worsening and now causing his L leg pain and weakness. Was seen Monday and had blood work and UA. Denies STD exposure or urinary symptoms.

## 2024-08-12 NOTE — ED Provider Notes (Signed)
 " [name] AT [name] Provider Note   CSN: [id number] Arrival date & time: 08/12/24  9286     Patient presents with: Groin Pain   Phillip Leach is a 29 y.o. male presenting ED with lower abdominal pain ongoing for about a month.  He reports mostly pain or pressure sensation near his bladder and sometimes in his penis.  He denies persistent hematuria, denies penile discharge.  Denies fevers or chills.  Reports is moving his bowels regularly.   HPI     Prior to Admission medications  Medication Sig Start Date End Date Taking? Authorizing Provider  sulfamethoxazole -trimethoprim  (BACTRIM  DS) 800-160 MG tablet Take 1 tablet by mouth 2 (two) times daily for 7 days. 08/12/24 08/19/24 Yes Deena Shaub, Donnice PARAS, MD  albuterol  (VENTOLIN  HFA) 108 [phone] Base) MCG/ACT inhaler TAKE 2 PUFFS BY MOUTH EVERY 6 HOURS AS NEEDED FOR WHEEZE OR SHORTNESS OF BREATH Patient not taking: Reported on 08/14/2023 09/11/22   Newlin, Enobong, MD  azithromycin  (ZITHROMAX ) 250 MG tablet Take 1 tablet (250 mg total) by mouth daily. Patient not taking: Reported on 08/14/2023 12/24/22   Geroldine Berg, MD  betamethasone  valerate ointment (VALISONE ) 0.1 % Apply 1 Application topically 2 (two) times daily. 01/20/24   Barrett, Warren SAILOR, PA-C  EPINEPHrine  0.3 mg/0.3 mL IJ SOAJ injection Inject 0.3 mg into the muscle as needed for anaphylaxis. 01/20/24   Barrett, Jamie N, PA-C  mometasone  (ELOCON ) 0.1 % cream Apply topically daily. Patient not taking: Reported on 08/14/2023 08/20/22   Fleming, Zelda W, NP  predniSONE  (DELTASONE ) 10 MG tablet Take 4 tablets a day with food for 5 days, 3 tabs a day for 4 days, 2 tabs a day for 4 days, 1 tab a day for 4 days, 1/2 tab a day for 4 day Patient not taking: Reported on 08/14/2023 09/13/22   Theotis Haze ORN, NP    Allergies: Minocin [minocycline hcl], Molds & smuts, Shellfish allergy, Amoxicillin-pot clavulanate, and Penicillins    Review of Systems  Updated Vital  Signs BP (!) 158/99   Pulse 82   Temp 97.9 F (36.6 C) (Oral)   Resp 18   Ht 6' (1.829 m)   Wt 108.8 kg   SpO2 100%   BMI 32.53 kg/m   Physical Exam Constitutional:      General: He is not in acute distress. HENT:     Head: Normocephalic and atraumatic.  Eyes:     Conjunctiva/sclera: Conjunctivae normal.     Pupils: Pupils are equal, round, and reactive to light.  Cardiovascular:     Rate and Rhythm: Normal rate and regular rhythm.  Pulmonary:     Effort: Pulmonary effort is normal. No respiratory distress.  Abdominal:     General: There is no distension.     Tenderness: There is abdominal tenderness in the suprapubic area.  Genitourinary:    Comments: GU exam with normal uncircumcised penis.  There is no drainage from the meatus.  No erythema or lesions of the penis or the scrotum.  Testicles are unremarkable, no epididymal tenderness, no palpable hernia Skin:    General: Skin is warm and dry.  Neurological:     General: No focal deficit present.     Mental Status: He is alert. Mental status is at baseline.  Psychiatric:        Mood and Affect: Mood normal.        Behavior: Behavior normal.     (all labs ordered are listed, but only  abnormal results are displayed) Labs Reviewed  URINALYSIS, ROUTINE W REFLEX MICROSCOPIC - Abnormal; Notable for the following components:      Result Value   Hgb urine dipstick TRACE (*)    Leukocytes,Ua TRACE (*)    All other components within normal limits  URINE CULTURE  GC/CHLAMYDIA PROBE AMP ([place]) NOT AT [facility]    EKG: None  Radiology: CT Renal Stone Study Result Date: 08/12/2024 EXAM: CT ABDOMEN AND PELVIS WITHOUT CONTRAST 08/12/2024 08:08:47 AM TECHNIQUE: CT of the abdomen and pelvis was performed without the administration of intravenous contrast. Multiplanar reformatted images are provided for review. Automated exposure control, iterative reconstruction, and/or weight-based adjustment of the mA/kV was utilized to  reduce the radiation dose to as low as reasonably achievable. COMPARISON: 08/19/2013 CLINICAL HISTORY: Abdominal/flank pain, stone suspected. FINDINGS: LOWER CHEST: No acute abnormality. LIVER: The liver is unremarkable. GALLBLADDER AND BILE DUCTS: Decompressed gallbladder. No radiopaque stones. No biliary ductal dilatation. SPLEEN: No acute abnormality. PANCREAS: No acute abnormality. ADRENAL GLANDS: No acute abnormality. KIDNEYS, URETERS AND BLADDER: No stones in the kidneys or ureters. No hydronephrosis. No perinephric or periureteral stranding. The urinary bladder is nearly completely decompressed without focal abnormality. GI AND BOWEL: Stomach demonstrates no acute abnormality. Colonic diverticulosis in the ascending colon. Likely postsurgical changes of prior appendicitis. There is no bowel obstruction. PERITONEUM AND RETROPERITONEUM: No ascites. No free air. No free pelvic fluid. VASCULATURE: Aorta is normal in caliber. LYMPH NODES: No lymphadenopathy. REPRODUCTIVE ORGANS: No prostatomegaly. BONES AND SOFT TISSUES: No acute osseous abnormality. No focal soft tissue abnormality. IMPRESSION: 1. No acute findings in the abdomen or pelvis. 2. Scattered chronic diverticulosis in the ascending colon. No changes of acute diverticulitis. Electronically signed by: Rogelia Myers MD MD 08/12/2024 08:19 AM EST RP Workstation: [id number]     Procedures   Medications Ordered in the ED  sulfamethoxazole -trimethoprim  (BACTRIM  DS) 800-160 MG per tablet 1 tablet (has no administration in time range)                                    Medical Decision Making Amount and/or Complexity of Data Reviewed Labs: ordered. Radiology: ordered.  Risk Prescription drug management.   Patient is here with some suprapubic discomfort, intermittent ongoing for about a month.  Differential includes cystitis versus STI versus ureteral colic or stone versus other intra-abdominal process  No GU symptoms or significant  tenderness to raise concerns for acute appendicitis.  No evidence or concern for testicular torsion or epididymitis based on GU exam.  I personally reviewed the patient's workup in the ED, notable for no emergent findings on CT imaging.  Incidental diverticulosis noted and discussed with patient.  UA does have trace leukocytes, no significant bacteria or white blood cell count.  However, given the patient's complaint of dysuria, I think it is reasonable to treat at this point for possible bladder infection or cystitis.  Will send a urine culture.  Will start him on Bactrim  for 7 days.    Patient would not provide direct response to STI screening questions, but says it is possible, let's check it when I ask about potential STI exposure.  Will send off urine sample for chlamydia and gonorrhea screening, but I will hold off on empiric treatment until we have a result.     Final diagnoses:  Dysuria    ED Discharge Orders          Ordered  sulfamethoxazole -trimethoprim  (BACTRIM  DS) 800-160 MG tablet  2 times daily        08/12/24 0847               Cottie Donnice PARAS, MD 08/12/24 [phone]  "

## 2024-08-12 NOTE — Discharge Instructions (Signed)
 Please follow-up on your patient portal for the results of your sexual transmission infection panel.  This includes testing for gonorrhea and chlamydia.  These test can take 3-5 business days to result.  You should avoid any sex until you have the results back.  If you test positive for these infections, you will be called in an antibiotic prescription for the infection. In that case, you should also notify all of your prior sexual partners to be tested and treated for possible infection.

## 2024-08-12 NOTE — ED Notes (Signed)
 Patient transported to CT

## 2024-08-13 LAB — URINE CULTURE: Culture: NO GROWTH

## 2024-08-13 LAB — GC/CHLAMYDIA PROBE AMP (~~LOC~~) NOT AT ARMC
Chlamydia: NEGATIVE
Comment: NEGATIVE
Comment: NORMAL
Neisseria Gonorrhea: NEGATIVE
